# Patient Record
Sex: Male | Born: 1960 | Race: White | Hispanic: No | Marital: Single | Smoking: Never smoker
Health system: Southern US, Community
[De-identification: ages and names within clinical notes are randomized; demographics above are authoritative.]

## PROBLEM LIST (undated history)

## (undated) DIAGNOSIS — F319 Bipolar disorder, unspecified: Secondary | ICD-10-CM

## (undated) DIAGNOSIS — J45909 Unspecified asthma, uncomplicated: Secondary | ICD-10-CM

## (undated) DIAGNOSIS — E079 Disorder of thyroid, unspecified: Secondary | ICD-10-CM

## (undated) HISTORY — DX: Disorder of thyroid, unspecified: E07.9

## (undated) HISTORY — DX: Unspecified asthma, uncomplicated: J45.909

## (undated) HISTORY — DX: Bipolar disorder, unspecified: F31.9

---
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
## 2011-04-20 ENCOUNTER — Inpatient Hospital Stay (HOSPITAL_COMMUNITY)
Admission: EM | Admit: 2011-04-20 | Discharge: 2011-04-22 | DRG: 313 | Disposition: A | Discharge status: Home / Self Care | Payer: 59 | Attending: Cardiology | Admitting: Cardiology

## 2011-04-20 ENCOUNTER — Emergency Department (HOSPITAL_COMMUNITY): Payer: 59

## 2011-04-20 DIAGNOSIS — I498 Other specified cardiac arrhythmias: Secondary | ICD-10-CM | POA: Diagnosis present

## 2011-04-20 DIAGNOSIS — E785 Hyperlipidemia, unspecified: Secondary | ICD-10-CM | POA: Diagnosis present

## 2011-04-20 DIAGNOSIS — J45909 Unspecified asthma, uncomplicated: Secondary | ICD-10-CM | POA: Diagnosis present

## 2011-04-20 DIAGNOSIS — R079 Chest pain, unspecified: Secondary | ICD-10-CM

## 2011-04-20 DIAGNOSIS — R0789 Other chest pain: Principal | ICD-10-CM | POA: Diagnosis present

## 2011-04-20 DIAGNOSIS — I318 Other specified diseases of pericardium: Secondary | ICD-10-CM | POA: Diagnosis present

## 2011-04-20 DIAGNOSIS — Z880 Allergy status to penicillin: Secondary | ICD-10-CM

## 2011-04-20 LAB — DIFFERENTIAL
Basophils Relative: 1 % (ref 0–1)
Eosinophils Absolute: 0.2 10*3/uL (ref 0.0–0.7)
Eosinophils Relative: 2 % (ref 0–5)
Monocytes Absolute: 0.7 10*3/uL (ref 0.1–1.0)
Monocytes Relative: 8 % (ref 3–12)
Neutrophils Relative %: 48 % (ref 43–77)

## 2011-04-20 LAB — POCT I-STAT TROPONIN I: Troponin i, poc: 0.02 ng/mL (ref 0.00–0.08)

## 2011-04-20 LAB — CBC
MCH: 30.8 pg (ref 26.0–34.0)
MCHC: 36.6 g/dL — ABNORMAL HIGH (ref 30.0–36.0)
Platelets: 233 10*3/uL (ref 150–400)
RDW: 12 % (ref 11.5–15.5)

## 2011-04-20 LAB — CK TOTAL AND CKMB (NOT AT ARMC)
CK, MB: 6.7 ng/mL (ref 0.3–4.0)
Total CK: 237 U/L — ABNORMAL HIGH (ref 7–232)

## 2011-04-20 LAB — COMPREHENSIVE METABOLIC PANEL
Alkaline Phosphatase: 52 U/L (ref 39–117)
BUN: 15 mg/dL (ref 6–23)
CO2: 26 mEq/L (ref 19–32)
Chloride: 103 mEq/L (ref 96–112)
Creatinine, Ser: 0.81 mg/dL (ref 0.50–1.35)
GFR calc non Af Amer: >60 mL/min (ref >60)
Potassium: 3.6 mEq/L (ref 3.5–5.1)
Total Bilirubin: 0.7 mg/dL (ref 0.3–1.2)

## 2011-04-20 LAB — POCT I-STAT, CHEM 8
BUN: 16 mg/dL (ref 6–23)
Calcium, Ion: 1.23 mmol/L (ref 1.12–1.32)
Glucose, Bld: 95 mg/dL (ref 70–99)
TCO2: 24 mmol/L (ref 0–100)

## 2011-04-21 ENCOUNTER — Inpatient Hospital Stay (HOSPITAL_COMMUNITY): Payer: 59

## 2011-04-21 DIAGNOSIS — R072 Precordial pain: Secondary | ICD-10-CM

## 2011-04-21 LAB — LIPID PANEL
HDL: 36 mg/dL — ABNORMAL LOW (ref >39)
LDL Cholesterol: 130 mg/dL — ABNORMAL HIGH (ref 0–99)
Triglycerides: 231 mg/dL — ABNORMAL HIGH (ref <150)
VLDL: 46 mg/dL — ABNORMAL HIGH (ref 0–40)

## 2011-04-21 LAB — CARDIAC PANEL(CRET KIN+CKTOT+MB+TROPI)
CK, MB: 5.4 ng/mL — ABNORMAL HIGH (ref 0.3–4.0)
CK, MB: 5.2 ng/mL — ABNORMAL HIGH (ref 0.3–4.0)
Relative Index: 3.1 — ABNORMAL HIGH (ref 0.0–2.5)
Troponin I: <0.3 ng/mL (ref <0.30)
Troponin I: <0.3 ng/mL (ref <0.30)

## 2011-04-21 LAB — HEMOGLOBIN A1C: Mean Plasma Glucose: 117 mg/dL — ABNORMAL HIGH (ref <117)

## 2011-04-21 MED ORDER — IOHEXOL 300 MG/ML  SOLN
80.0000 mL | Freq: Once | INTRAMUSCULAR | Status: AC | PRN
  Administered 2011-04-21: 80 mL via INTRAVENOUS

## 2011-04-22 ENCOUNTER — Inpatient Hospital Stay (HOSPITAL_COMMUNITY): Payer: 59

## 2011-04-22 DIAGNOSIS — R079 Chest pain, unspecified: Secondary | ICD-10-CM

## 2011-04-22 MED ORDER — TECHNETIUM TC 99M TETROFOSMIN IV KIT: 10.0000 | PACK | Freq: Once | INTRAVENOUS | Status: DC | PRN

## 2011-04-22 MED ORDER — TECHNETIUM TC 99M TETROFOSMIN IV KIT
30.0000 | PACK | Freq: Once | INTRAVENOUS | Status: AC | PRN
  Administered 2011-04-22: 30 via INTRAVENOUS

## 2011-04-22 MED ORDER — TECHNETIUM TC 99M TETROFOSMIN IV KIT
30.0000 | PACK | Freq: Once | INTRAVENOUS | Status: AC | PRN
  Administered 2011-04-22: 15:00:00 30 via INTRAVENOUS

## 2011-04-23 NOTE — Discharge Summary (Addendum)
NAME:  Erik Arroyo, Erik Arroyo NO.:  [id number]  MEDICAL RECORD NO.:  [id number]  LOCATION:  6533                         FACILITY:  [facility]  PHYSICIAN:  Erik Arroyo, MDDATE OF BIRTH:  07/08/1961  DATE OF ADMISSION:  04/20/2011 DATE OF DISCHARGE:  04/22/2011                              DISCHARGE SUMMARY   DISCHARGE DIAGNOSES: 1. Chest pain, felt atypical     a.     Initially elevated CKs and MBs, but negative troponins x3.     b.     Nuclear Myoview stress test April 22, 2011, demonstrating      no evidence of ischemia or infarction with ejection fraction of      53%  c.  CT angio April 21, 2011, demonstrated no evidence of      pulmonary embolism. 2. Oval-shaped fluid density, question small pericardial cyst by CT     angio of April 21, 2011, for cardiac MRI to be scheduled to     further evaluate. 3. Dyslipidemia, for outpatient followup with primary care provider. 4. 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 5. History of tooth abscess. 6. Asthma. 7. History of appendectomy. 8. History of tonsillectomy.  HOSPITAL COURSE:  [name] is a 50 year old gentleman with a history of asymptomatic bradycardia and asthma with no prior history of coronary artery disease.  He presented to his PCP's office for chest pain, centralized chest pressure not associated with any shortness of breath or nausea but was associated with clamminess.  It was nonradiating, but has been worse with inspiration.  He has not noted any exertional symptoms.  His symptoms were felt to be both typical and atypical.  EKG demonstrated no acute ST-T changes.  Initial point-of-care troponin was negative.  He was admitted to the hospital for rule out and did have elevation CK to peak of 237 and peak MB of 6.7, but negative troponin x3.  He had a CT angio that demonstrated no evidence of pulmonary embolism.  He had a 2D nuclear Myoview stress test demonstrating no evidence  of ischemia or infarction.  EF was 53%.  The patient tolerated both procedures well.  His chest pain felt to be noncardiac in etiology. I personally discussed the CT angio results with Dr. Clifton Arroyo regarding the possible pericardial cyst as described below, who has recommended that the patient have a cardiac MRI.  This will be scheduled by our office.  Dr. Clifton Arroyo has seen and examined the patient today and feels he is stable for discharge.  The patient was concern that some of his symptoms may be related to tooth abscess.  He stated that they were similar to when he had a tooth abscess remotely.  However, physical examination by PA Erik Arroyo was unremarkable and white blood cell count was not elevated.  The patient was afebrile.  He has already scheduled an appointment with his PCP for tomorrow morning and was instructed to keep this appointment. Dr. Clifton Arroyo would like to hold off on antibiotic usage for now and strongly encouraged the patient to follow up with his primary care provider.  DISCHARGE LABS:  WBC 7.9, hemoglobin 16, hematocrit 47, platelet count 233.  Sodium 130, potassium  3.6, chloride 103, CO2 of 26, glucose 85, BUN 15, creatinine 0.81.  LFTs were normal.  A1c was 5.7.  The cardiac enzymes described as above.  Total cholesterol 212, triglycerides 231, HDL 36, LDL 130.  The patient was instructed to follow up with his primary care provider regarding initiation of lifestyle changes and further monitoring of hyperlipidemia.  STUDIES: 1. Stress test on April 22, 2011, demonstrated no evidence of     ischemia or infarction.  EF was 53%. 2. CT angio April 21, 2011, which demonstrated no PE.  Oval-shaped     fluid density collection along the superior pericardial recess, may     represents some pericardial cyst 3.2 x 1.7 cm or unusual localized     collection of fluid in the superior pericardial recess.  DISCHARGE MEDICATIONS: 1. Advair Diskus 250/50 one puff  inhale daily. 2. Multivitamin half tablet 2 times weekly. 3. St. John Wort 1 tablet daily nightly. 4. Vitamin D3 one tablet 1-2 times weekly.  ALLERGIES:  PENICILLIN.  DISPOSITION:  Mr. Erik Arroyo will be discharged in stable condition to home. He is to follow a heart-healthy diet and keep his IV site clean and dry. He already has an appointment scheduled tomorrow with his primary care provider at 11:20 a.m. and is instructed to keep this appointment for further evaluation as there are 2 concerns.  He will have a cardiac MRI to further evaluate possible pericardial cyst by CT angio which will be scheduled in our office and will call him with this appointment.  Our office will also call him with a followup appointment with Dr. Myrtis Ser sometime thereafter.  The patient specifically requested that his record to be sent over to Dr. Tawana Arroyo office.  However had been notified by the floor staff upon request of this to be done that the patient must go to medical records in the morning.  He is aware of this plan.  DURATION OF DISCHARGE ENCOUNTER:  Greater than 30 minutes including physician and PA time.     Erik Arroyo, P.A.C.   ______________________________ Erik Carrow, MD    DD/MEDQ  D:  04/22/2011  T:  04/22/2011  Job:  [id number]  cc:   Gloriajean Dell. Andrey Campanile, M.D. Luis Abed, MD, [facility]  Electronically Signed by Erik Carrow MD on 04/23/2011 05:02:30 PM Electronically Signed by Erik Arroyo  on 05/04/2011 07:32:22 PM

## 2011-04-30 ENCOUNTER — Telehealth: Payer: Self-pay | Admitting: Cardiology

## 2011-05-01 NOTE — Telephone Encounter (Signed)
Per after hour voice mail  need an order for cardiac mri .

## 2011-05-04 ENCOUNTER — Other Ambulatory Visit: Payer: Self-pay

## 2011-05-07 NOTE — H&P (Signed)
NAME:  ALOK, MINSHALL NO.:  [id number]  MEDICAL RECORD NO.:  [id number]  LOCATION:  6533                         FACILITY:  [facility]  PHYSICIAN:  Harlon Flor, MD   DATE OF BIRTH:  1961-07-21  DATE OF ADMISSION:  04/20/2011 DATE OF DISCHARGE:                             HISTORY & PHYSICAL   PRIMARY PCP:  Gloriajean Dell. Andrey Campanile, MD  PRIMARY CARDIOLOGIST:  There is no primary cardiologist.  CHIEF COMPLAINT:  Chest pain.  HISTORY OF PRESENT ILLNESS:  Mr. Cowden is a pleasant 50 year old white male, who was sent here from his PCP's office with chest pain.  He says it started in the 11:30 this morning.  He noted centralized chest pressure, not associated with shortness of breath or nausea, but was associated with some clamminess.  It is not radiated, but it has been worse with inspiration.  He has not noted any worsening with walking or exertion.  There was some questionable relief with nitroglycerin.  His pain is almost gone, but still lingering at 1-2 currently.  His pain then was constant from 11:30 this morning until presenting to the emergency room.  This evening at 6 p.m. in which his enzymes were found to be negative x1.  He has never had a history of coronary disease.  PAST MEDICAL HISTORY: 1. Asymptomatic bradycardia evaluated years ago. 2. History of a tooth abscess. 3. Asthma. 4. History of appendectomy. 5. History of tonsillectomy.  MEDICATIONS:  Advair 250/50 b.i.d. and vitamin supplement.  ALLERGIES:  PENICILLIN causes a rash.  SOCIAL HISTORY:  The patient lives alone in [place].  He works in [employer].  He is not married.  He does not smoke tobacco and drinks only occasionally.  FAMILY HISTORY:  His mother was diagnosed with coronary artery disease in her 28s.  His father does not have any medical problems and his siblings are healthy.  REVIEW OF SYSTEMS:  A full review of systems is obtained and is negative except as stated  in the HPI other than headache that began prior to him receiving nitroglycerin earlier today.  PHYSICAL EXAMINATION:  VITAL SIGNS:  Blood pressure 110/57, pulse 52, temperature 96.7, respiratory rate 18. GENERAL:  No acute distress. HEENT:  Extraocular movements intact.  Oropharynx benign.  Nonicteric sclerae. NECK:  Supple. CARDIOVASCULAR:  Regular rhythm with normal S1 and S2.  No murmurs. LUNGS:  Clear to auscultation bilaterally. ABDOMEN:  Soft, nontender, nondistended.  No hepatosplenomegaly. EXTREMITIES:  No clubbing, cyanosis, or edema. SKIN:  No rashes. Lymph:  No lymphadenopathy. NEUROLOGIC:  He is alert and oriented x3 and moves all extremities well. No gross deficits.  Pulses are intact throughout.  Right femoral is 2+ bilateral dorsalis pedis, posterior tibial 2+.  Chest x-ray reviewed and is clear.  EKG shows sinus bradycardia with a rate of 50, and no acute ST-T changes.  White count is 7.9, hemoglobin 16, platelets 233.  Basic metabolic panel is within normal limits.  His first point-of-care troponin is negative.  ASSESSMENT AND PLAN:  Mr. Sagona is a pleasant 50 year old white male, here with unstable angina.  He has some typical and atypical features of his pain.  His first set of  cardiac markers is negative. 1. Unstable angina:  We will give him aspirin 325 as well as Lovenox 1     mcg/kg every 12-hour subcu.  If he rules in with positive     biomarkers, we will also add Plavix.  We will check his baseline     labs including lipids and depending on how he does tonight, we will     plan for cath versus further risk stratification with stress     testing if his enzymes are negative. 2. Bradycardia:  He is asymptomatic, has not had any syncopal or near     syncopal spells.  No evidence of heart block on EKG.  We will keep     him on telemetry tonight.     Harlon Flor, MD     MMB/MEDQ  D:  04/20/2011  T:  04/21/2011  Job:  [id number]  cc:   Gloriajean Dell.  Andrey Campanile, M.D.  Electronically Signed by Meridee Score MD on 05/07/2011 08:14:03 PM

## 2011-05-21 ENCOUNTER — Encounter: Payer: Self-pay | Admitting: Cardiology

## 2011-05-26 ENCOUNTER — Encounter: Payer: Self-pay | Admitting: *Deleted

## 2011-05-28 ENCOUNTER — Inpatient Hospital Stay (HOSPITAL_COMMUNITY): Admission: RE | Admit: 2011-05-28 | Payer: 59 | Source: Ambulatory Visit

## 2011-12-27 IMAGING — CT CT ANGIO CHEST
2 of 8 series · 13 of 36 positions shown · IV contrast (CONTRAST)
Comparison: 04/05/2011

CLINICAL DATA: Chest pain.

CT ANGIOGRAPHY CHEST WITH CONTRAST
TECHNIQUE: Multidetector CT imaging of the chest was performed
using the standard protocol during bolus administration of
intravenous contrast.  Multiplanar CT image reconstructions
including MIPs were obtained to evaluate the vascular anatomy.
Contrast:  80 ml Hmnipaque-QHH

[Series 5: pe thins · axial · 0.88mm/px · z∈[-343,-50]mm · 12 of 347 slices shown]
[im 27/347  lung]
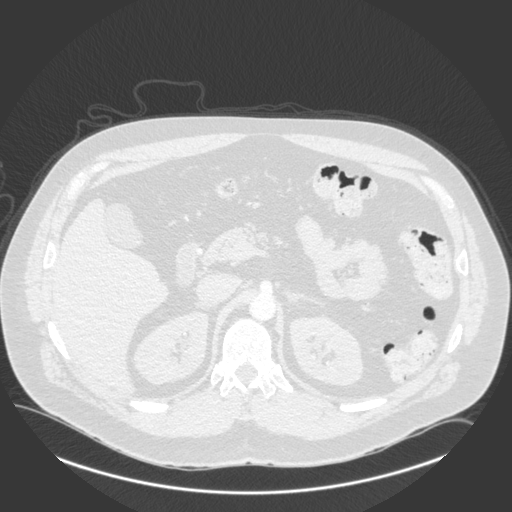
[im 54/347  mediastinal]
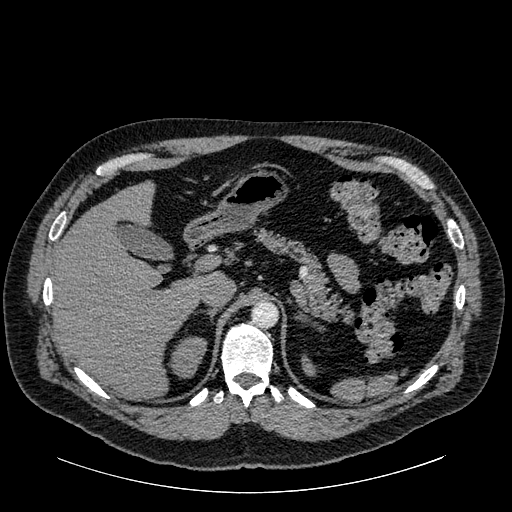
[im 80/347  lung]
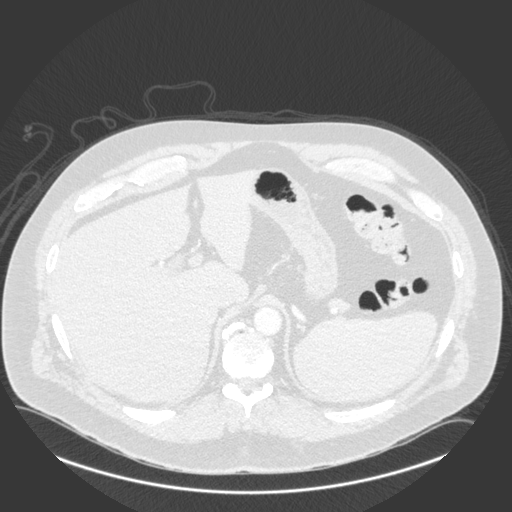
[im 107/347  mediastinal]
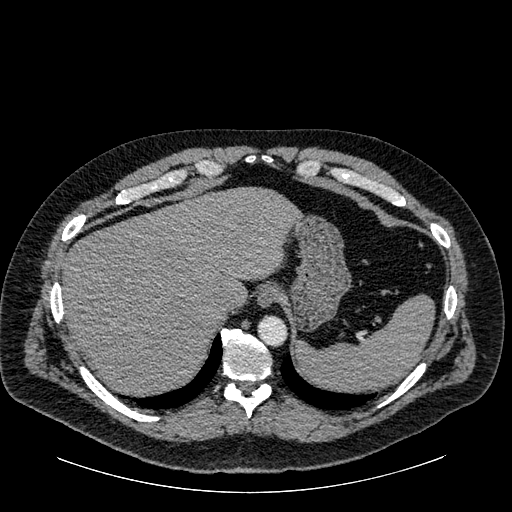
[im 134/347  lung]
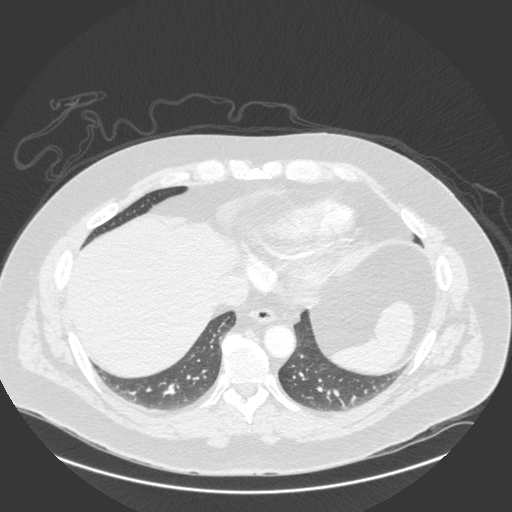
[im 160/347  mediastinal]
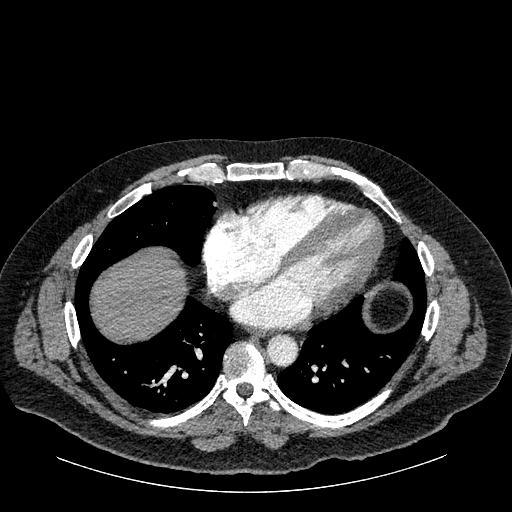
[im 187/347  lung]
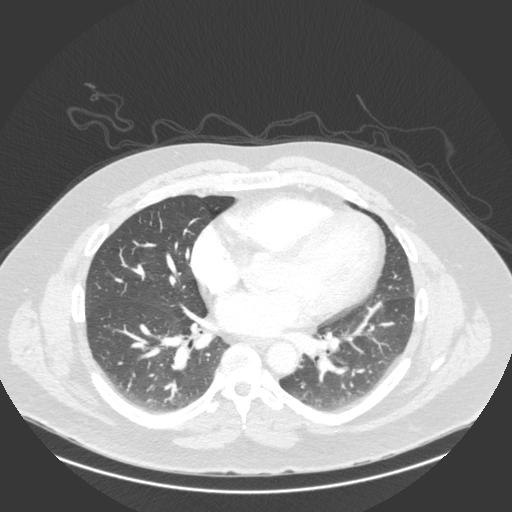
[im 213/347  mediastinal]
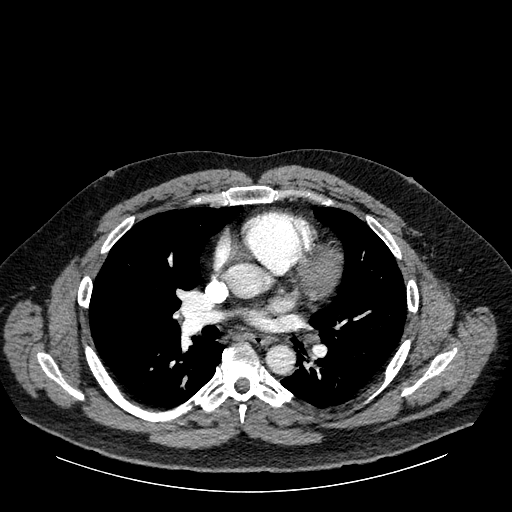
[im 240/347  lung]
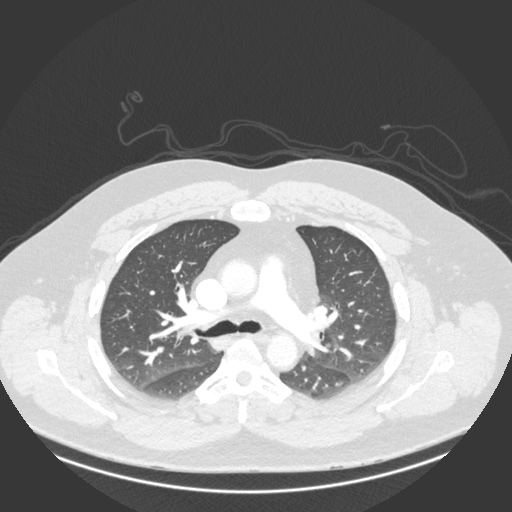
[im 267/347  mediastinal]
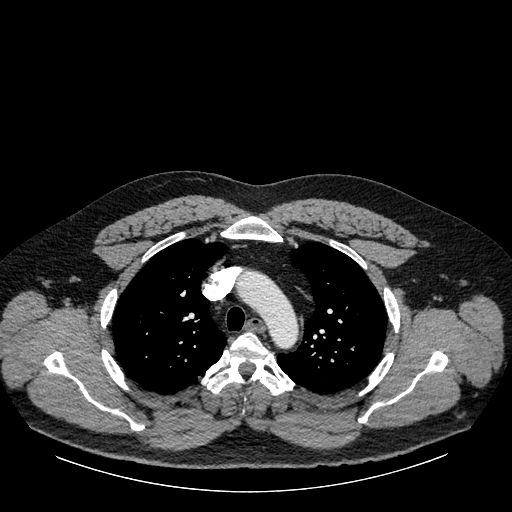
[im 293/347  lung]
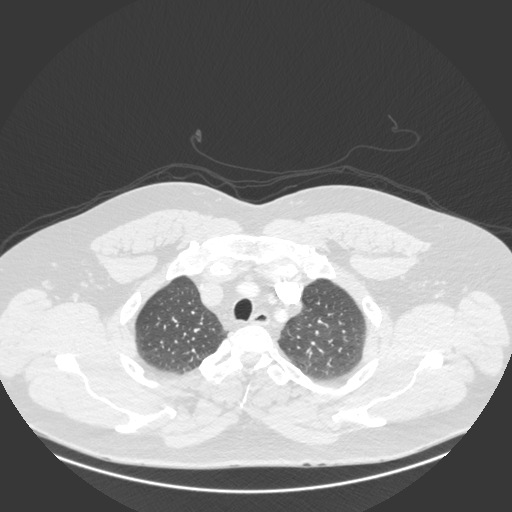
[im 320/347  mediastinal]
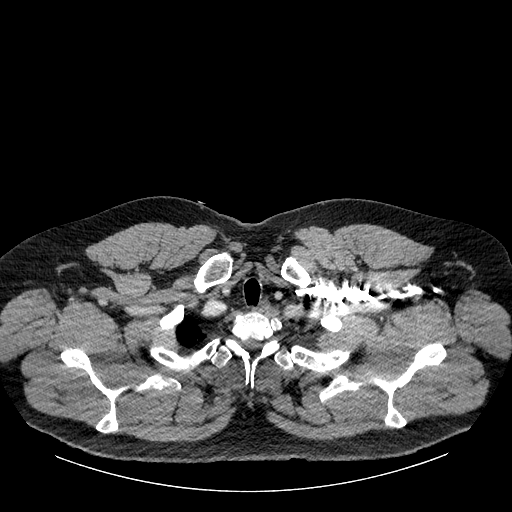

[coronals · coronal · 0.88mm/px · 1 of 138 slices shown]
[im 69/138  mediastinal]
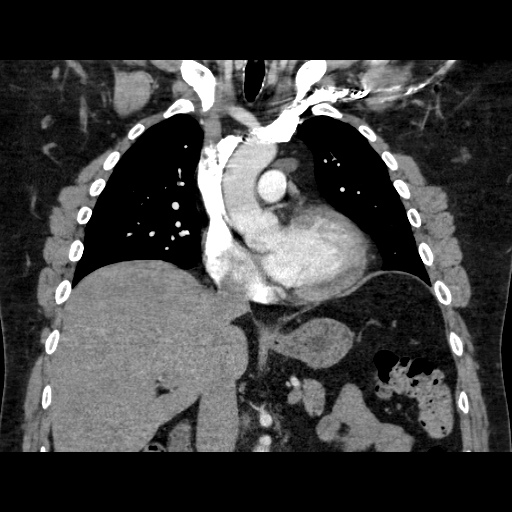

[13 of 36 positions shown; findings below may reference images not displayed]

FINDINGS: Contrast phase is moderately late, but images are felt
to be adequate for diagnosis.

No reflux of contrast into the IVC or significant contour
abnormality of the interventricular septum is identified to
indicate significantly elevated right heart pressures.

No filling defect is identified in the pulmonary arterial tree to
suggest pulmonary embolus.

No pericardial effusion is identified.  No thoracic aortic
dissection noted.  Several tiny subpleural nodules along the
superior extent of the right major fissure likely represent
subpleural lymph nodes.

Slight mosaic attenuation in the lower lobes is likely from air
trapping and mild dependent subsegmental atelectasis.

On image 48 of series 4, the fluid density oval shaped 3.2 x 1.7 cm
collection along the superior pericardial recess is identified.

Review of the MIP images confirms the above findings.
IMPRESSION: 1.  No embolus is observed.
2. Mild air trapping and dependent subsegmental atelectasis account
for the slight mosaic attenuation in the lower lobes.
3.  Oval shaped fluid density collection along the superior
pericardial recess may represent a small pericardial cyst or
unusual localized collection of fluid in the superior pericardial
recess.

## 2013-08-07 ENCOUNTER — Encounter: Payer: Self-pay | Admitting: Internal Medicine

## 2013-09-07 ENCOUNTER — Ambulatory Visit (AMBULATORY_SURGERY_CENTER): Payer: Self-pay | Admitting: *Deleted

## 2013-09-07 ENCOUNTER — Telehealth: Payer: Self-pay | Admitting: *Deleted

## 2013-09-07 VITALS — Ht 78.0 in | Wt 354.4 lb

## 2013-09-07 DIAGNOSIS — Z1211 Encounter for screening for malignant neoplasm of colon: Secondary | ICD-10-CM

## 2013-09-07 MED ORDER — MOVIPREP 100 G PO SOLR
ORAL | Status: AC
Start: 2013-09-07 — End: ?

## 2013-09-07 NOTE — Telephone Encounter (Signed)
Okay for screening colon on my next hospital week.  Monday to follow procedure already scheduled would be good

## 2013-09-07 NOTE — Telephone Encounter (Signed)
Dr Hilarie Fredrickson: pt was scheduled at [facility] for screening colonoscopy.  Pt is 6'6' and weighs 354.4lb.  His only medical history is asthma.  Is he okay for direct hospital or do you want him to have OV before scheduling at hospital?  Caren Griffins:  I gave pt general instructions for Moviprep while he was in [place].  All you will need to do is review times with him when he is scheduled at hospital.

## 2013-09-07 NOTE — Progress Notes (Signed)
No allergies to eggs or soy. No problems with anesthesia.  

## 2013-09-08 ENCOUNTER — Other Ambulatory Visit: Payer: Self-pay | Admitting: *Deleted

## 2013-09-08 DIAGNOSIS — Z1211 Encounter for screening for malignant neoplasm of colon: Secondary | ICD-10-CM

## 2013-09-08 NOTE — Telephone Encounter (Signed)
lmom for pt to call me back. He is scheduled at [facility] on 09/25/13 at 10:45am. I mailed him corrected prep instructions. For the procedure.

## 2013-09-11 ENCOUNTER — Telehealth: Payer: Self-pay | Admitting: Internal Medicine

## 2013-09-11 NOTE — Telephone Encounter (Signed)
noted 

## 2013-09-11 NOTE — Telephone Encounter (Signed)
lmom for pt to call back. He did identify himself on the answering machine, so I left a detailed message of procedure date and that I did mail him a letter. He may call back for questions.

## 2013-09-11 NOTE — Telephone Encounter (Signed)
Girlfriend called, but pt was in the room, to ask if we can r/s the procedure to another day. R/S to 09/29/12 and friend informed.

## 2013-09-12 ENCOUNTER — Encounter: Payer: Self-pay | Admitting: Internal Medicine

## 2013-09-13 ENCOUNTER — Encounter (HOSPITAL_COMMUNITY): Payer: Self-pay | Admitting: Pharmacy Technician

## 2013-09-21 ENCOUNTER — Encounter: Payer: 59 | Admitting: Internal Medicine

## 2013-09-26 ENCOUNTER — Encounter (HOSPITAL_COMMUNITY): Payer: Self-pay | Admitting: *Deleted

## 2013-09-29 ENCOUNTER — Ambulatory Visit (HOSPITAL_COMMUNITY): Payer: 59 | Admitting: Anesthesiology

## 2013-09-29 ENCOUNTER — Encounter (HOSPITAL_COMMUNITY)
Admission: RE | Disposition: A | Discharge status: Home / Self Care | Payer: Self-pay | Source: Ambulatory Visit | Attending: Internal Medicine

## 2013-09-29 ENCOUNTER — Encounter (HOSPITAL_COMMUNITY): Payer: Self-pay

## 2013-09-29 ENCOUNTER — Encounter (HOSPITAL_COMMUNITY): Payer: 59 | Admitting: Anesthesiology

## 2013-09-29 ENCOUNTER — Ambulatory Visit (HOSPITAL_COMMUNITY)
Admission: RE | Admit: 2013-09-29 | Discharge: 2013-09-29 | Disposition: A | Discharge status: Home / Self Care | Payer: 59 | Source: Ambulatory Visit | Attending: Internal Medicine | Admitting: Internal Medicine

## 2013-09-29 DIAGNOSIS — Z9089 Acquired absence of other organs: Secondary | ICD-10-CM | POA: Insufficient documentation

## 2013-09-29 DIAGNOSIS — Z1211 Encounter for screening for malignant neoplasm of colon: Secondary | ICD-10-CM

## 2013-09-29 DIAGNOSIS — J45909 Unspecified asthma, uncomplicated: Secondary | ICD-10-CM

## 2013-09-29 DIAGNOSIS — E669 Obesity, unspecified: Secondary | ICD-10-CM | POA: Insufficient documentation

## 2013-09-29 DIAGNOSIS — Z79899 Other long term (current) drug therapy: Secondary | ICD-10-CM | POA: Insufficient documentation

## 2013-09-29 DIAGNOSIS — D126 Benign neoplasm of colon, unspecified: Secondary | ICD-10-CM

## 2013-09-29 HISTORY — PX: COLONOSCOPY: SHX5424

## 2013-09-29 SURGERY — COLONOSCOPY
Anesthesia: Monitor Anesthesia Care

## 2013-09-29 MED ORDER — PROPOFOL INFUSION 10 MG/ML OPTIME
INTRAVENOUS | Status: DC | PRN
  Administered 2013-09-29: 300 ug/kg/min via INTRAVENOUS

## 2013-09-29 MED ORDER — EPHEDRINE SULFATE 50 MG/ML IJ SOLN
INTRAMUSCULAR | Status: DC | PRN
  Administered 2013-09-29: 10 mg via INTRAVENOUS
  Administered 2013-09-29 (×2): 5 mg via INTRAVENOUS

## 2013-09-29 MED ORDER — PROPOFOL 10 MG/ML IV BOLUS
INTRAVENOUS | Status: AC
  Filled 2013-09-29: qty 20

## 2013-09-29 MED ORDER — KETAMINE HCL 10 MG/ML IJ SOLN
INTRAMUSCULAR | Status: DC | PRN
  Administered 2013-09-29: 50 mg via INTRAVENOUS

## 2013-09-29 MED ORDER — SODIUM CHLORIDE 0.9 % IV SOLN: INTRAVENOUS | Status: DC

## 2013-09-29 MED ORDER — EPHEDRINE SULFATE 50 MG/ML IJ SOLN
INTRAMUSCULAR | Status: AC
  Filled 2013-09-29: qty 1

## 2013-09-29 MED ORDER — LACTATED RINGERS IV SOLN
INTRAVENOUS | Status: DC
  Administered 2013-09-29: 09:00:00 via INTRAVENOUS

## 2013-09-29 NOTE — Anesthesia Postprocedure Evaluation (Signed)
  Anesthesia Post-op Note  Patient: Erik Arroyo  Procedure(s) Performed: Procedure(s) (LRB): COLONOSCOPY (N/A)  Patient Location: PACU  Anesthesia Type: MAC  Level of Consciousness: awake and alert   Airway and Oxygen Therapy: Patient Spontanous Breathing  Post-op Pain: mild  Post-op Assessment: Post-op Vital signs reviewed, Patient's Cardiovascular Status Stable, Respiratory Function Stable, Patent Airway and No signs of Nausea or vomiting  Last Vitals:  Filed Vitals:   09/29/13 1150  BP: 102/54  Pulse:   Temp:   Resp: 16    Post-op Vital Signs: stable   Complications: No apparent anesthesia complications

## 2013-09-29 NOTE — Preoperative (Signed)
Beta Blockers   Reason not to administer Beta Blockers:Not Applicable, not on home BB 

## 2013-09-29 NOTE — H&P (Signed)
  HPI: 53 year old with a history of asthma and obesity who presents for outpatient screening colonoscopy. His primary care providers Dr. Redmond Pulling who recommended the test. He reports he tolerated the prep well. Negative family history for colon cancer. For the last 2-3 weeks he has felt an abnormal sensation in his rectum. He has a difficult time describing this feeling and it doesn't seem to relate to bowel movement. Not described as a true pain but at times can be uncomfortable lasting only a few seconds. No rectal bleeding or melena.  Past Medical History  Diagnosis Date  . Asthma     Tx. Advair daily    Past Surgical History  Procedure Laterality Date  . Tonsillectomy  1969  . Appendectomy  1977    Current Facility-Administered Medications  Medication Dose Route Frequency Provider Last Rate Last Dose  . 0.9 %  sodium chloride infusion   Intravenous Continuous Jerene Bears, MD      . lactated ringers infusion   Intravenous Continuous Peyton Najjar, MD 20 mL/hr at 09/29/13 0857      Allergies  Allergen Reactions  . Penicillins Rash    Family History  Problem Relation Age of Onset  . Colon cancer Neg Hx     History  Substance Use Topics  . Smoking status: Never Smoker   . Smokeless tobacco: Never Used  . Alcohol Use: 3.0 oz/week    5 Cans of beer per week    ROS: As per history of present illness, otherwise negative  BP 136/99  Pulse 60  Temp(Src) 97.6 F (36.4 C) (Oral)  Resp 11  SpO2 97% Gen: awake, alert, NAD HEENT: anicteric, op clear CV: RRR, no mrg Pulm: CTA b/l Abd: soft, obese, NT/ND, +BS throughout Ext: no c/c/e Neuro: nonfocal  ASSESSMENT/PLAN: 53 year old with a history of asthma and obesity who presents for outpatient screening colonoscopy.   1.CRC screening -- avg risk from [facility] standpoint.  The nature of the procedure, as well as the risks, benefits, and alternatives were carefully and thoroughly reviewed with the patient. Ample time for  discussion and questions allowed. The patient understood, was satisfied, and agreed to proceed.

## 2013-09-29 NOTE — Discharge Instructions (Signed)
YOU HAD AN ENDOSCOPIC PROCEDURE TODAY: Refer to the procedure report that was given to you for any specific questions about what was found during the examination.  If the procedure report does not answer your questions, please call your gastroenterologist to clarify.  YOU SHOULD EXPECT: Some feelings of bloating in the abdomen. Passage of more gas than usual.  Walking can help get rid of the air that was put into your GI tract during the procedure and reduce the bloating. If you had a lower endoscopy (such as a colonoscopy or flexible sigmoidoscopy) you may notice spotting of blood in your stool or on the toilet paper.   DIET: Your first meal following the procedure should be a light meal and then it is ok to progress to your normal diet.  A half-sandwich or bowl of soup is an example of a good first meal.  Heavy or fried foods are harder to digest and may make you feel nasueas or bloated.  Drink plenty of fluids but you should avoid alcoholic beverages for 24 hours.  ACTIVITY: Your care partner should take you home directly after the procedure.  You should plan to take it easy, moving slowly for the rest of the day.  You can resume normal activity the day after the procedure however you should NOT DRIVE or use heavy machinery for 24 hours (because of the sedation medicines used during the test).    SYMPTOMS TO REPORT IMMEDIATELY  A gastroenterologist can be reached at any hour.  Please call your doctor's office for any of the following symptoms:   Following lower endoscopy (colonoscopy, flexible sigmoidoscopy)  Excessive amounts of blood in the stool  Significant tenderness, worsening of abdominal pains  Swelling of the abdomen that is new, acute  Fever of 100 or higher  Following upper endoscopy (EGD, EUS, ERCP)  Vomiting of blood or coffee ground material  New, significant abdominal pain  New, significant chest pain or pain under the shoulder blades  Painful or persistently difficult  swallowing  New shortness of breath  Black, tarry-looking stools  FOLLOW UP: If any biopsies were taken you will be contacted by phone or by letter within the next 1-3 weeks.  Call your gastroenterologist if you have not heard about the biopsies in 3 weeks.  Please also call your gastroenterologist's office with any specific questions about appointments or follow up tests. Colonoscopy Care After These instructions give you information on caring for yourself after your procedure. Your doctor may also give you more specific instructions. Call your doctor if you have any problems or questions after your procedure. HOME CARE  Take it easy for the next 24 hours.  Rest.  Walk or use warm packs on your belly (abdomen) if you have belly cramping or gas.  Do not drive for 24 hours.  You may shower.  Do not sign important papers or use machinery for 24 hours.  Drink enough fluids to keep your pee (urine) clear or pale yellow.  Resume your normal diet. Avoid heavy or fried foods.  Avoid alcohol.  Continue taking your normal medicines.  Only take medicine as told by your doctor. Do not take aspirin. If you had growths (polyps) removed:  Do not take aspirin.  Do not drink alcohol for 7 days or as told by your doctor.  Eat a soft diet for 24 hours. GET HELP RIGHT AWAY IF:  You have a fever.  You pass clumps of tissue (blood clots) or fill the toilet with  blood. °· You have belly pain that gets worse and medicine does not help. °· Your belly is puffy (swollen). °· You feel sick to your stomach (nauseous) or throw up (vomit). °MAKE SURE YOU: °· Understand these instructions. °· Will watch your condition. °· Will get help right away if you are not doing well or get worse. °Document Released: 09/12/2010 Document Revised: 11/02/2011 Document Reviewed: 04/17/2013 °[employer]® Patient Information ©2014 [employer]. ° °

## 2013-09-29 NOTE — Transfer of Care (Signed)
Immediate Anesthesia Transfer of Care Note  Patient: Erik Arroyo  Procedure(s) Performed: Procedure(s): COLONOSCOPY (N/A)  Patient Location: PACU, endo  Anesthesia Type:MAC  Level of Consciousness: Patient easily awoken, sedated, comfortable, cooperative, following commands, responds to stimulation.   Airway & Oxygen Therapy: Patient spontaneously breathing, ventilating well, oxygen via simple oxygen mask.  Post-op Assessment: Report given to PACU RN, vital signs reviewed and stable, moving all extremities.   Post vital signs: Reviewed and stable.  Complications: No apparent anesthesia complications

## 2013-09-29 NOTE — Anesthesia Preprocedure Evaluation (Addendum)
Anesthesia Evaluation  Patient identified by MRN, date of birth, ID band Patient awake    Reviewed: Allergy & Precautions, H&P , NPO status , Patient's Chart, lab work & pertinent test results  Airway Mallampati: II TM Distance: >3 FB Neck ROM: full    Dental no notable dental hx. (+) Teeth Intact and Dental Advisory Given   Pulmonary asthma ,  breath sounds clear to auscultation  Pulmonary exam normal       Cardiovascular Exercise Tolerance: Good negative cardio ROS  Rhythm:regular Rate:Normal     Neuro/Psych negative neurological ROS  negative psych ROS   GI/Hepatic negative GI ROS, Neg liver ROS,   Endo/Other  negative endocrine ROSMorbid obesity  Renal/GU negative Renal ROS  negative genitourinary   Musculoskeletal   Abdominal (+) + obese,   Peds  Hematology negative hematology ROS (+)   Anesthesia Other Findings   Reproductive/Obstetrics negative OB ROS                          Anesthesia Physical Anesthesia Plan  ASA: III  Anesthesia Plan: MAC   Post-op Pain Management:    Induction:   Airway Management Planned: Nasal Cannula  Additional Equipment:   Intra-op Plan:   Post-operative Plan:   Informed Consent: I have reviewed the patients History and Physical, chart, labs and discussed the procedure including the risks, benefits and alternatives for the proposed anesthesia with the patient or authorized representative who has indicated his/her understanding and acceptance.   Dental Advisory Given  Plan Discussed with: CRNA and Surgeon  Anesthesia Plan Comments:         Anesthesia Quick Evaluation

## 2013-09-29 NOTE — Op Note (Signed)
[facility] [place], [address]   COLONOSCOPY PROCEDURE REPORT  PATIENT: Erik Arroyo, Erik Arroyo  MR#: [id number] BIRTHDATE: December 07, 1960 , 53  yrs. old GENDER: Male ENDOSCOPIST: Jerene Bears, MD REFERRED JK:DTOI Redmond Pulling, MD PROCEDURE DATE:  09/29/2013 PROCEDURE:   Colonoscopy with cold biopsy polypectomy and Colonoscopy with snare polypectomy First Screening Colonoscopy - Avg.  risk and is 50 yrs.  old or older Yes.  Prior Negative Screening - Now for repeat screening. N/A  History of Adenoma - Now for follow-up colonoscopy & has been > or = to 3 yrs.  N/A  Polyps Removed Today? Yes. ASA CLASS:   Class II INDICATIONS:average risk screening and first colonoscopy. MEDICATIONS: MAC sedation, administered by CRNA and See Anesthesia Report.  DESCRIPTION OF PROCEDURE:   After the risks benefits and alternatives of the procedure were thoroughly explained, informed consent was obtained.  A digital rectal exam revealed no rectal mass.   The     endoscope was introduced through the anus and advanced to the cecum, which was identified by both the appendix and ileocecal valve. No adverse events experienced.   The quality of the prep was good, using MoviPrep  The instrument was then slowly withdrawn as the colon was fully examined.   COLON FINDINGS: Three sessile polyps measuring 3-6 mm in size were found in the ascending colon, at the hepatic flexure, and in the sigmoid colon.  Polypectomy was performed with cold forceps (1 at hep flexure) and using cold snare (2).  All resections were complete and all polyp tissue was completely retrieved.   The colon mucosa was otherwise normal.  Retroflexed views revealed no abnormalities.        The scope was withdrawn and the procedure completed. COMPLICATIONS: There were no complications.  ENDOSCOPIC IMPRESSION: 1.   Three sessile polyps measuring 3-6 mm in size were found in the ascending colon, at the hepatic flexure,  and in the sigmoid colon; Polypectomy was performed with cold forceps and using cold snare 2.   The colon mucosa was otherwise normal  RECOMMENDATIONS: 1.  Await pathology results 2.  If the polyps removed today are proven to be adenomatous (pre-cancerous) polyps, you will need a colonoscopy in 3 years. Otherwise you should continue to follow colorectal cancer screening guidelines for "routine risk" patients with a colonoscopy in 10 years.  You will receive a letter within 1-2 weeks with the results of your biopsy as well as final recommendations.  Please call my office if you have not received a letter after 3 weeks.   eSigned:  Jerene Bears, MD 09/29/2013 10:46 AM  cc: The Patient and Kathryne Eriksson, MD

## 2013-10-02 ENCOUNTER — Encounter (HOSPITAL_COMMUNITY): Payer: Self-pay | Admitting: Internal Medicine

## 2013-10-05 ENCOUNTER — Encounter: Payer: Self-pay | Admitting: Internal Medicine

## 2016-09-21 ENCOUNTER — Encounter: Payer: Self-pay | Admitting: Internal Medicine

## 2018-04-22 ENCOUNTER — Ambulatory Visit: Payer: Self-pay | Admitting: Podiatry

## 2018-08-19 ENCOUNTER — Ambulatory Visit: Payer: Self-pay | Admitting: Physician Assistant

## 2018-08-21 DIAGNOSIS — F3175 Bipolar disorder, in partial remission, most recent episode depressed: Secondary | ICD-10-CM | POA: Insufficient documentation

## 2018-09-13 ENCOUNTER — Telehealth: Payer: Self-pay | Admitting: Physician Assistant

## 2018-09-13 NOTE — Telephone Encounter (Signed)
PATIENT HAS NOT HAD LABS THAT WERE ORDERED, HAS AN APPT. ON Friday 01/24 WANTS TO KNOW CAN HE STILL COME TO APPT., IF HE HAS NOT HAD LABS, PLEASE ADVISE

## 2018-09-13 NOTE — Telephone Encounter (Signed)
Yes keep appt

## 2018-09-16 ENCOUNTER — Ambulatory Visit: Payer: Self-pay | Admitting: Physician Assistant

## 2018-09-16 ENCOUNTER — Encounter: Payer: Self-pay | Admitting: Physician Assistant

## 2018-09-16 ENCOUNTER — Other Ambulatory Visit: Payer: Self-pay | Admitting: Physician Assistant

## 2018-09-16 ENCOUNTER — Ambulatory Visit (INDEPENDENT_AMBULATORY_CARE_PROVIDER_SITE_OTHER): Payer: Self-pay | Admitting: Physician Assistant

## 2018-09-16 DIAGNOSIS — G4700 Insomnia, unspecified: Secondary | ICD-10-CM

## 2018-09-16 DIAGNOSIS — F319 Bipolar disorder, unspecified: Secondary | ICD-10-CM

## 2018-09-16 MED ORDER — LITHIUM CARBONATE 300 MG PO CAPS: 1500.0000 mg | ORAL_CAPSULE | Freq: Every day | ORAL | 1 refills | Status: DC

## 2018-09-16 MED ORDER — TRAZODONE HCL 100 MG PO TABS: 100.0000 mg | ORAL_TABLET | Freq: Every evening | ORAL | 1 refills | Status: DC | PRN

## 2018-09-16 NOTE — Progress Notes (Signed)
[facility]  Patient ID: Erik Arroyo,  MRN: [id number]  PCP: Christain Sacramento, MD  Date of Evaluation: 09/16/2018 Time spent:15 minutes  Chief Complaint:  Chief Complaint    Follow-up      HISTORY/CURRENT STATUS: HPI Here for routine med check.  Patient denies loss of interest in usual activities and is able to enjoy things.  Denies decreased energy or motivation.  Appetite has not changed.  No extreme sadness, tearfulness, or feelings of hopelessness.  Denies any changes in concentration, making decisions or remembering things.  Denies suicidal or homicidal thoughts.  Patient denies increased energy with decreased need for sleep, no increased talkativeness, no racing thoughts, no impulsivity or risky behaviors, no increased spending, no increased libido, no grandiosity.  As long as he takes the trazodone he sleeps well.  He had labs drawn this morning. Results pending.   Denies dizziness, syncope, seizures, numbness, tingling, tremor, tics, unsteady gait, slurred speech, confusion.   Denies muscle or joint pain, stiffness, or dystonia.  Individual Medical History/ Review of Systems: Changes? :No   Allergies: Penicillins  Current Medications:  Current Outpatient Medications:  .  fluticasone (FLONASE) 50 MCG/ACT nasal spray, Place 1 spray into both nostrils daily., Disp: , Rfl:  .  Fluticasone-Salmeterol (ADVAIR DISKUS) 250-50 MCG/DOSE AEPB, Inhale 1 puff into the lungs 2 (two) times daily., Disp: , Rfl:  .  lithium carbonate 300 MG capsule, Take 5 capsules (1,500 mg total) by mouth at bedtime., Disp: 450 capsule, Rfl: 1 .  traZODone (DESYREL) 100 MG tablet, Take 1-3 tablets (100-300 mg total) by mouth at bedtime as needed for sleep. 3 tabs, Disp: 270 tablet, Rfl: 1 .  MELATONIN PO, Take 4 mg by mouth at bedtime. , Disp: , Rfl:  .  MOVIPREP 100 G SOLR, moviprep as directed. No substitutions (Patient not taking: Reported on 09/16/2018), Disp: 1 kit, Rfl:  0 Medication Side Effects: none  Family Medical/ Social History: Changes? 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  Works a lot.   MENTAL HEALTH EXAM:  There were no vitals taken for this visit.There is no height or weight on file to calculate BMI.  General Appearance: Casual, Well Groomed and Obese  Eye Contact:  Good  Speech:  Clear and Coherent  Volume:  Normal  Mood:  Euthymic  Affect:  Appropriate  Thought Process:  Goal Directed  Orientation:  Full (Time, Place, and Person)  Thought Content: Logical   Suicidal Thoughts:  No  Homicidal Thoughts:  No  Memory:  WNL  Judgement:  Good  Insight:  Good  Psychomotor Activity:  Normal  Concentration:  Concentration: Good  Recall:  Good  Fund of Knowledge: Good  Language: Good  Assets:  Desire for Improvement  ADL's:  Intact  Cognition: WNL  Prognosis:  Good    DIAGNOSES:    ICD-10-CM   1. Bipolar I disorder ([place]) F31.9   2. Insomnia, unspecified type G47.00     Receiving Psychotherapy: No    RECOMMENDATIONS: Continue lithium 1500 mg nightly. Continue trazodone 100 to 300 mg nightly as needed sleep. Continue self-care with healthy diet, exercise. Once I get the lab results back, we will let him know what is lithium level he is and if there are any dose changes. Return in 6 months or sooner as needed.  Donnal Moat, PA-C

## 2018-09-17 LAB — LITHIUM LEVEL: Lithium Lvl: 0.4 mmol/L — ABNORMAL LOW (ref 0.6–1.2)

## 2018-09-17 LAB — TSH: TSH: 2.99 m[IU]/L (ref 0.40–4.50)

## 2018-10-31 ENCOUNTER — Telehealth: Payer: Self-pay

## 2018-10-31 MED ORDER — LITHIUM CARBONATE 300 MG PO TABS: 1500.0000 mg | ORAL_TABLET | Freq: Every day | ORAL | 1 refills | Status: DC

## 2018-10-31 NOTE — Telephone Encounter (Signed)
Received phone call from [place] on pt's Lithium 300mg  5 Q hs, he normally takes tablets instead of capsules. Switched over to tablets per pt request.

## 2018-11-11 NOTE — Progress Notes (Signed)
He seems to be doing well on current Li dose, so continue that, even w/ low level (2nd low in 2 months and he's taking med as dir.  TSH is fine.

## 2019-03-06 ENCOUNTER — Ambulatory Visit (INDEPENDENT_AMBULATORY_CARE_PROVIDER_SITE_OTHER): Payer: Self-pay | Admitting: Physician Assistant

## 2019-03-06 ENCOUNTER — Encounter: Payer: Self-pay | Admitting: Physician Assistant

## 2019-03-06 ENCOUNTER — Other Ambulatory Visit: Payer: Self-pay

## 2019-03-06 DIAGNOSIS — F319 Bipolar disorder, unspecified: Secondary | ICD-10-CM

## 2019-03-06 DIAGNOSIS — G4700 Insomnia, unspecified: Secondary | ICD-10-CM

## 2019-03-06 MED ORDER — TRAZODONE HCL 100 MG PO TABS: 100.0000 mg | ORAL_TABLET | Freq: Every evening | ORAL | 1 refills | Status: DC | PRN

## 2019-03-06 MED ORDER — LITHIUM CARBONATE 300 MG PO TABS: 1500.0000 mg | ORAL_TABLET | Freq: Every day | ORAL | 1 refills | Status: DC

## 2019-03-06 NOTE — Progress Notes (Signed)
[facility]  Patient ID: Erik Arroyo,  MRN: [id number]  PCP: Christain Sacramento, MD  Date of Evaluation: 03/06/2019 Time spent:15 minutes  Chief Complaint:  Chief Complaint    Follow-up     Virtual Visit via Telephone Note  I connected with patient by a video enabled telemedicine application or telephone, with their informed consent, and verified patient privacy and that I am speaking with the correct person using two identifiers.  I am private, in my home and the patient is home.  I discussed the limitations, risks, security and privacy concerns of performing an evaluation and management service by telephone and the availability of in person appointments. I also discussed with the patient that there may be a patient responsible charge related to this service. The patient expressed understanding and agreed to proceed.   I discussed the assessment and treatment plan with the patient. The patient was provided an opportunity to ask questions and all were answered. The patient agreed with the plan and demonstrated an understanding of the instructions.   The patient was advised to call back or seek an in-person evaluation if the symptoms worsen or if the condition fails to improve as anticipated.  I provided 15 minutes of non-face-to-face time during this encounter.  HISTORY/CURRENT STATUS: HPI For routine med check.   Is on [employer] and has 60 # in the past 6 months or so. Doing great as far as that goes.  Feels that mood is good. Was lonely during the pandemic but is better now since he's been able to go back to church and be around people some.  His mother is in an assisted living home and even though he is able to see her through the window, "it is not the same."  States he is glad to be able to see her at all though.  Work is going well.  He still wants to find a new job.  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  He prefers to be around  people.  He sleeps well with the trazodone.  Patient denies loss of interest in usual activities and is able to enjoy things.  Denies decreased energy or motivation.  Appetite has not changed.  No extreme sadness, tearfulness, or feelings of hopelessness.  Denies any changes in concentration, making decisions or remembering things.  Denies suicidal or homicidal thoughts.  Patient denies increased energy with decreased need for sleep, no increased talkativeness, no racing thoughts, no impulsivity or risky behaviors, no increased spending, no increased libido, no grandiosity.  Denies dizziness, syncope, seizures, numbness, tingling, tremor, tics, unsteady gait, slurred speech, confusion. Denies muscle or joint pain, stiffness, or dystonia.  Individual Medical History/ Review of Systems: Changes? :No    Past medications for mental health diagnoses include: uncertain  Allergies: Penicillins  Current Medications:  Current Outpatient Medications:  .  fluticasone (FLONASE) 50 MCG/ACT nasal spray, Place 1 spray into both nostrils daily., Disp: , Rfl:  .  Fluticasone-Salmeterol (ADVAIR DISKUS) 250-50 MCG/DOSE AEPB, Inhale 1 puff into the lungs 2 (two) times daily., Disp: , Rfl:  .  lithium 300 MG tablet, Take 5 tablets (1,500 mg total) by mouth at bedtime., Disp: 450 tablet, Rfl: 1 .  traZODone (DESYREL) 100 MG tablet, Take 1-3 tablets (100-300 mg total) by mouth at bedtime as needed for sleep. 3 tabs, Disp: 270 tablet, Rfl: 1 .  MOVIPREP 100 G SOLR, moviprep as directed. No substitutions (Patient  not taking: Reported on 09/16/2018), Disp: 1 kit, Rfl: 0 Medication Side Effects: none  Family Medical/ Social History: Changes? No  MENTAL HEALTH EXAM:  There were no vitals taken for this visit.There is no height or weight on file to calculate BMI.  General Appearance: unable to assess  Eye Contact:  unable to assess  Speech:  Clear and Coherent  Volume:  Normal  Mood:  Euthymic  Affect:  unable  to assess  Thought Process:  Goal Directed  Orientation:  Full (Time, Place, and Person)  Thought Content: Logical   Suicidal Thoughts:  No  Homicidal Thoughts:  No  Memory:  WNL  Judgement:  Good  Insight:  Good  Psychomotor Activity:  unable to assess  Concentration:  Concentration: Good  Recall:  Good  Fund of Knowledge: Good  Language: Good  Assets:  Desire for Improvement  ADL's:  Intact  Cognition: WNL  Prognosis:  Good  09/17/2018 lithium level was 0.4, while on 1500 mg of lithium.  DIAGNOSES:    ICD-10-CM   1. Bipolar I disorder ([place])  F31.9   2. Insomnia, unspecified type  G47.00     Receiving Psychotherapy: No    RECOMMENDATIONS:  Continue lithium 300 mg, 5 p.o. nightly. Continue trazodone 100 mg, 1-3 p.o. nightly as needed sleep. Draw lithium level at next visit. Return in 6 months.  Donnal Moat, PA-C   This record has been created using [employer].  Chart creation errors have been sought, but may not always have been located and corrected. Such creation errors do not reflect on the standard of medical care.

## 2019-03-17 ENCOUNTER — Ambulatory Visit: Payer: Self-pay | Admitting: Physician Assistant

## 2019-08-30 ENCOUNTER — Other Ambulatory Visit: Payer: Self-pay | Admitting: Physician Assistant

## 2019-08-30 ENCOUNTER — Telehealth: Payer: Self-pay | Admitting: Physician Assistant

## 2019-08-30 DIAGNOSIS — Z79899 Other long term (current) drug therapy: Secondary | ICD-10-CM

## 2019-08-30 NOTE — Telephone Encounter (Signed)
done

## 2019-08-30 NOTE — Telephone Encounter (Signed)
I ordered the labs.  Please mail him the printed copies of the orders.  They should be on the printer up front.  Thanks.

## 2019-08-30 NOTE — Telephone Encounter (Signed)
Patient called and said that he goes to [facility] [address]. Please send the request there

## 2019-09-01 LAB — LITHIUM LEVEL: Lithium Lvl: 0.6 mmol/L (ref 0.6–1.2)

## 2019-09-01 LAB — BASIC METABOLIC PANEL
BUN: 18 mg/dL (ref 7–25)
CO2: 25 mmol/L (ref 20–32)
Calcium: 9.6 mg/dL (ref 8.6–10.3)
Chloride: 105 mmol/L (ref 98–110)
Creat: 1 mg/dL (ref 0.70–1.33)
Glucose, Bld: 95 mg/dL (ref 65–99)
Potassium: 4.1 mmol/L (ref 3.5–5.3)
Sodium: 138 mmol/L (ref 135–146)

## 2019-09-01 LAB — TSH: TSH: 6.27 mIU/L — ABNORMAL HIGH (ref 0.40–4.50)

## 2019-09-01 NOTE — Progress Notes (Signed)
Left him a vm to return my call.  

## 2019-09-01 NOTE — Progress Notes (Signed)
Please let him know that his thyroid test is slightly abnormal.  Not serious.  He has an appt w/ me on 09/05/19 and I'll discuss it w/ him then.  Labs are nl otherwise. lithium level is good.

## 2019-09-04 NOTE — Progress Notes (Signed)
Okay; thanks.

## 2019-09-04 NOTE — Progress Notes (Signed)
He has an appt w/ me tomorrow, so I'll discuss w/ him then.  If for some reason, he doesn't come in, I'll let you know and ask you to call him again.  Thanks.

## 2019-09-04 NOTE — Progress Notes (Signed)
Left him another VM today to return my call.

## 2019-09-05 ENCOUNTER — Ambulatory Visit: Payer: BLUE CROSS/BLUE SHIELD | Admitting: Physician Assistant

## 2019-09-07 NOTE — Progress Notes (Signed)
Left him a Vm to return my call.

## 2019-09-07 NOTE — Progress Notes (Signed)
Please let him know that his thyroid test was a little off.  Not bad, but this can sometimes happen when you take lithium. I was going to discuss w/ him at our appt, but I had to r/s. It won't hurt if we wait till appt in 3 weeks to discuss, but we'll need to start thyroid medicine. Then recheck lab in 4-6 weeks.  See if he wants to go ahead and start the med or wait to discuss w/ me at appt.  Thanks.

## 2019-09-08 NOTE — Progress Notes (Signed)
Left him a Vm to return my call.

## 2019-09-11 NOTE — Progress Notes (Signed)
Still unable to reach. Left him another VM to return my call.

## 2019-09-12 ENCOUNTER — Other Ambulatory Visit: Payer: Self-pay | Admitting: Physician Assistant

## 2019-09-12 MED ORDER — LEVOTHYROXINE SODIUM 25 MCG PO TABS: 25.0000 ug | ORAL_TABLET | Freq: Every day | ORAL | 0 refills | Status: DC

## 2019-09-12 NOTE — Progress Notes (Signed)
When you meet with him he would like to discuss labs from July as well. He stated he had them sent to you.

## 2019-09-12 NOTE — Progress Notes (Signed)
Rx sent in for Synthroid 25 mcg qd. No further action needed.

## 2019-09-12 NOTE — Progress Notes (Signed)
I'll talk w/ him at next OV. No need to keep calling.  Thanks

## 2019-09-12 NOTE — Progress Notes (Signed)
Nyshawn changed his mind and would like to go ahead and start the medication. Please send to [facility] [address]

## 2019-09-12 NOTE — Progress Notes (Signed)
Ok. Thanks!

## 2019-09-12 NOTE — Progress Notes (Signed)
He returned phone call this morning. Pt. Made aware and would like to wait and to speak with you before starting a new medication.

## 2019-09-28 ENCOUNTER — Telehealth: Payer: Self-pay

## 2019-09-28 NOTE — Telephone Encounter (Signed)
Pt. called to report he has been fatigued and feeling terrible. He decidied to try taking 2 of the Thyroid pills instead of one and states he feels better. He wanted me to let you know he would talk to you in length about it on Tuesday at his appt.

## 2019-09-28 NOTE — Telephone Encounter (Signed)
Ok

## 2019-10-03 ENCOUNTER — Ambulatory Visit (INDEPENDENT_AMBULATORY_CARE_PROVIDER_SITE_OTHER): Payer: BLUE CROSS/BLUE SHIELD | Admitting: Physician Assistant

## 2019-10-03 ENCOUNTER — Encounter: Payer: Self-pay | Admitting: Physician Assistant

## 2019-10-03 ENCOUNTER — Other Ambulatory Visit: Payer: Self-pay

## 2019-10-03 DIAGNOSIS — R5383 Other fatigue: Secondary | ICD-10-CM

## 2019-10-03 DIAGNOSIS — E032 Hypothyroidism due to medicaments and other exogenous substances: Secondary | ICD-10-CM

## 2019-10-03 DIAGNOSIS — F99 Mental disorder, not otherwise specified: Secondary | ICD-10-CM

## 2019-10-03 DIAGNOSIS — F5105 Insomnia due to other mental disorder: Secondary | ICD-10-CM

## 2019-10-03 DIAGNOSIS — F319 Bipolar disorder, unspecified: Secondary | ICD-10-CM

## 2019-10-03 MED ORDER — LEVOTHYROXINE SODIUM 50 MCG PO TABS: 50.0000 ug | ORAL_TABLET | Freq: Every day | ORAL | 1 refills | Status: DC

## 2019-10-03 NOTE — Progress Notes (Addendum)
[facility]  Patient ID: Erik Arroyo,  MRN: [id number]  PCP: Christain Sacramento, MD  Date of Evaluation: 10/03/2019 Time spent:30 minutes  Chief Complaint:  Chief Complaint    Follow-up; Other      HISTORY/CURRENT STATUS: HPI For routine med check.  We found that his TSH was slightly up since his LOV. On 09/12/19 he started the Synthroid.  "I took 2 b/c I thought it would make me feel better. I've been really tired. It helped right away.  But then I went back down to 1.  I was feeling worse.  So I called last week and you told me it is okay to take 2 pills.  I do not know whether I feel better or not right now."  Patient denies loss of interest in usual activities and is able to enjoy things.  Denies decreased energy or motivation.  Appetite has not changed.  No extreme sadness, tearfulness, or feelings of hopelessness.  Denies any changes in concentration, making decisions or remembering things.  Denies suicidal or homicidal thoughts.  Patient denies increased energy with decreased need for sleep, no increased talkativeness, no racing thoughts, no impulsivity or risky behaviors, no increased spending, no increased libido, no grandiosity.  Review of Systems  Constitutional: Positive for malaise/fatigue.  HENT: Negative.   Eyes: Negative.   Respiratory: Negative.   Cardiovascular: Negative.   Gastrointestinal: Negative.   Genitourinary: Negative.   Musculoskeletal: Negative.   Skin: Negative.   Neurological: Negative.   Endo/Heme/Allergies: Negative.   Psychiatric/Behavioral: Negative.    Individual Medical History/ Review of Systems: Changes? :Yes    Past medications for mental health diagnoses include: uncertain  Allergies: Penicillins  Current Medications:  Current Outpatient Medications:  .  fluticasone (FLONASE) 50 MCG/ACT nasal spray, Place 1 spray into both nostrils daily., Disp: , Rfl:  .  Fluticasone-Salmeterol (ADVAIR DISKUS) 250-50 MCG/DOSE AEPB,  Inhale 1 puff into the lungs 2 (two) times daily., Disp: , Rfl:  .  lithium 300 MG tablet, Take 5 tablets (1,500 mg total) by mouth at bedtime., Disp: 450 tablet, Rfl: 1 .  traZODone (DESYREL) 100 MG tablet, Take 1-3 tablets (100-300 mg total) by mouth at bedtime as needed for sleep. 3 tabs, Disp: 270 tablet, Rfl: 1 .  levothyroxine (SYNTHROID) 50 MCG tablet, Take 1 tablet (50 mcg total) by mouth daily before breakfast., Disp: 30 tablet, Rfl: 1 .  MOVIPREP 100 G SOLR, moviprep as directed. No substitutions (Patient not taking: Reported on 09/16/2018), Disp: 1 kit, Rfl: 0 Medication Side Effects: none  Family Medical/ Social History: Changes? No  MENTAL HEALTH EXAM:  There were no vitals taken for this visit.There is no height or weight on file to calculate BMI.  General Appearance: Casual, Neat, Well Groomed and Obese  Eye Contact:  Good  Speech:  Clear and Coherent  Volume:  Normal  Mood:  Euthymic  Affect:  Appropriate  Thought Process:  Goal Directed and Descriptions of Associations: Intact  Orientation:  Full (Time, Place, and Person)  Thought Content: Logical   Suicidal Thoughts:  No  Homicidal Thoughts:  No  Memory:  WNL  Judgement:  Good  Insight:  Good  Psychomotor Activity:  Normal  Concentration:  Concentration: Good  Recall:  Good  Fund of Knowledge: Good  Language: Good  Assets:  Desire for Improvement  ADL's:  Intact  Cognition: WNL  Prognosis:  Good  08/31/2019 TSH was slightly elevated at 6.27.  Lithium level was 0.6 BUN 18,  creatinine 1.0, glucose 95, the remainder of BMP was normal  DIAGNOSES:    ICD-10-CM   1. 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 Bipolar I disorder ([place])  F31.9   3. Fatigue, unspecified type  R53.83   4. Insomnia due to other mental disorder  F51.05    F99     Receiving Psychotherapy: No    RECOMMENDATIONS:  I spent 30 minutes with him. PDMP was reviewed. I explained the new diagnosis of hypothyroidism in detail.  We  discussed the treatment of levothyroxine and how this is a common diagnosis even in someone who is not on lithium.  However lithium is known to cause it.  It is not dangerous as long as we monitor the TSH.  We discussed the common symptoms.  He understands.  I have explained that taking a couple of pills of the Synthroid, he will likely feel better right away.  It may take 4 to 6 weeks.  I am fine with him taking the 50 mcg dose however and then we may need to change that dose depending on the next TSH.  He verbalizes understanding. Continue trazodone 100 mg, 1-3 p.o. nightly as needed sleep. Continue lithium 300 mg, 5 p.o. nightly. Continue Synthroid 50 mcg, 1 every morning before breakfast. TSH to be drawn in approximately 4 weeks. Our practice is out of network for his insurance.  Therefore I will not ask him to come back in for 4 months but if there are any problems he will have to be seen sooner.  He verbalizes understanding. Return in 4 months.  Donnal Moat, PA-C

## 2019-10-21 LAB — TSH: TSH: 5.35 mIU/L — ABNORMAL HIGH (ref 0.40–4.50)

## 2019-10-23 ENCOUNTER — Telehealth: Payer: Self-pay | Admitting: Physician Assistant

## 2019-10-23 NOTE — Telephone Encounter (Signed)
See lab note. Start Synthroid and repeat lab 6 weeks.  Will need his pharmacy and I'll order lab and have sent to him when I send in Rx.

## 2019-10-23 NOTE — Progress Notes (Signed)
TSH is still barely elevated.  Let's start Synthroid 25 mcg q am, and repeat level in 6 weeks.

## 2019-10-23 NOTE — Telephone Encounter (Signed)
Pt called checking status on lab results . Return call to pt @ [phone] this number not in system and don't change in system per pt.

## 2019-10-24 ENCOUNTER — Other Ambulatory Visit: Payer: Self-pay | Admitting: Physician Assistant

## 2019-10-24 DIAGNOSIS — E032 Hypothyroidism due to medicaments and other exogenous substances: Secondary | ICD-10-CM

## 2019-10-24 MED ORDER — LEVOTHYROXINE SODIUM 75 MCG PO TABS: 75.0000 ug | ORAL_TABLET | Freq: Every day | ORAL | 1 refills | Status: DC

## 2019-10-24 NOTE — Telephone Encounter (Signed)
I'm sorry for my mistake! He's been on current dose for 1 month. D/t fatigue and fact he's on Li, let's go ahead and increase Synthroid to 67mcg.  I'll send in Rx, need pharmacy.  I'll also order another TSH, which he should get in 6 weeks.  Also double check on the lab he will be going to, [facility]. we will mail that to him.

## 2019-10-24 NOTE — Telephone Encounter (Signed)
Left voicemail to call back to discuss labs and also need his pharmacy

## 2019-10-24 NOTE — Telephone Encounter (Signed)
You're right on the gradual increase of Synthroid. If we go too fast, it can end up causing hyperthyroidism and we do not want that either.  No need to call him back.

## 2019-10-24 NOTE — Progress Notes (Signed)
Patient aware of TSH level, advised to increase Synthroid 0.75 mcg. See phone message

## 2019-10-25 NOTE — Telephone Encounter (Signed)
Discussed the gradual increase with patient again, he's very anxious about feeling better that's why asking for a higher increase. Reassured him this was the best option and to give the medication time to work. Patient verbalized understanding.

## 2019-11-10 ENCOUNTER — Ambulatory Visit: Payer: Self-pay | Attending: Internal Medicine

## 2019-11-10 DIAGNOSIS — Z23 Encounter for immunization: Secondary | ICD-10-CM

## 2019-11-10 NOTE — Progress Notes (Signed)
   Covid-19 Vaccination Clinic  Name:  Erik Arroyo    MRN: [id number] DOB: [birth year]  11/10/2019  Erik Arroyo was observed post Covid-19 immunization for 15 minutes without incident. He was provided with Vaccine Information Sheet and instruction to access the V-Safe system.   Erik Arroyo was instructed to call 911 with any severe reactions post vaccine: Marland Kitchen Difficulty breathing  . Swelling of face and throat  . A fast heartbeat  . A bad rash all over body  . Dizziness and weakness   Immunizations Administered    Name Date Dose VIS Date Route   Pfizer COVID-19 Vaccine 11/10/2019  8:35 AM 0.3 mL 08/04/2019 Intramuscular   Manufacturer: [place]   Lot: [id number]   [place]: [id number]

## 2019-11-23 ENCOUNTER — Other Ambulatory Visit: Payer: Self-pay | Admitting: Physician Assistant

## 2019-12-05 ENCOUNTER — Ambulatory Visit: Payer: Self-pay | Attending: Internal Medicine

## 2019-12-05 DIAGNOSIS — Z23 Encounter for immunization: Secondary | ICD-10-CM

## 2019-12-05 NOTE — Progress Notes (Signed)
   Covid-19 Vaccination Clinic  Name:  Erik Arroyo    MRN: [id number] DOB: [birth year]  12/05/2019  Mr. Erik Arroyo was observed post Covid-19 immunization for 15 minutes without incident. He was provided with Vaccine Information Sheet and instruction to access the V-Safe system.   Mr. Erik Arroyo was instructed to call 911 with any severe reactions post vaccine: Marland Kitchen Difficulty breathing  . Swelling of face and throat  . A fast heartbeat  . A bad rash all over body  . Dizziness and weakness   Immunizations Administered    Name Date Dose VIS Date Route   Pfizer COVID-19 Vaccine 12/05/2019 10:16 AM 0.3 mL 08/04/2019 Intramuscular   Manufacturer: [place]   Lot: [id number]   [place]: [id number]

## 2019-12-07 ENCOUNTER — Other Ambulatory Visit: Payer: Self-pay

## 2019-12-07 LAB — TSH: TSH: 3.1 mIU/L (ref 0.40–4.50)

## 2019-12-07 MED ORDER — LITHIUM CARBONATE 300 MG PO TABS: 1500.0000 mg | ORAL_TABLET | Freq: Every day | ORAL | 1 refills | Status: DC

## 2019-12-07 MED ORDER — LEVOTHYROXINE SODIUM 75 MCG PO TABS: 75.0000 ug | ORAL_TABLET | Freq: Every day | ORAL | 5 refills | Status: DC

## 2019-12-07 NOTE — Progress Notes (Signed)
Please let him know the TSH is now in a good range.  Continue the current dose of levothyroxine.  Thanks

## 2020-02-01 ENCOUNTER — Ambulatory Visit: Payer: BLUE CROSS/BLUE SHIELD | Admitting: Physician Assistant

## 2020-05-28 ENCOUNTER — Other Ambulatory Visit: Payer: BLUE CROSS/BLUE SHIELD

## 2020-05-28 DIAGNOSIS — Z20822 Contact with and (suspected) exposure to covid-19: Secondary | ICD-10-CM

## 2020-05-29 LAB — NOVEL CORONAVIRUS, NAA: SARS-CoV-2, NAA: NOT DETECTED

## 2020-05-29 LAB — SARS-COV-2, NAA 2 DAY TAT

## 2020-08-31 ENCOUNTER — Ambulatory Visit: Payer: BLUE CROSS/BLUE SHIELD

## 2021-08-04 ENCOUNTER — Telehealth: Payer: Self-pay | Admitting: Physician Assistant

## 2021-08-04 NOTE — Telephone Encounter (Signed)
Pt called and wants to come back and see teresa. He has a new job and his new insurance will cover teresa need to know if you will see him again and how long do you need with him

## 2021-08-05 NOTE — Telephone Encounter (Signed)
Sure that's great.  30 minute appointment please.

## 2021-09-26 ENCOUNTER — Ambulatory Visit (HOSPITAL_BASED_OUTPATIENT_CLINIC_OR_DEPARTMENT_OTHER)
Admission: RE | Admit: 2021-09-26 | Discharge: 2021-09-26 | Disposition: A | Discharge status: Home / Self Care | Payer: BC Managed Care – PPO | Source: Ambulatory Visit | Attending: Orthopaedic Surgery | Admitting: Orthopaedic Surgery

## 2021-09-26 ENCOUNTER — Other Ambulatory Visit: Payer: Self-pay

## 2021-09-26 ENCOUNTER — Ambulatory Visit (INDEPENDENT_AMBULATORY_CARE_PROVIDER_SITE_OTHER): Payer: BC Managed Care – PPO | Admitting: Orthopaedic Surgery

## 2021-09-26 DIAGNOSIS — M25511 Pain in right shoulder: Secondary | ICD-10-CM | POA: Insufficient documentation

## 2021-09-26 DIAGNOSIS — M6520 Calcific tendinitis, unspecified site: Secondary | ICD-10-CM

## 2021-09-26 DIAGNOSIS — M65221 Calcific tendinitis, right upper arm: Secondary | ICD-10-CM

## 2021-09-26 MED ORDER — LIDOCAINE HCL 1 % IJ SOLN
4.0000 mL | INTRAMUSCULAR | Status: AC | PRN
  Administered 2021-09-26: 4 mL

## 2021-09-26 MED ORDER — TRIAMCINOLONE ACETONIDE 40 MG/ML IJ SUSP
80.0000 mg | INTRAMUSCULAR | Status: AC | PRN
  Administered 2021-09-26: 80 mg via INTRA_ARTICULAR

## 2021-09-26 NOTE — Progress Notes (Signed)
Chief Complaint: right shoulder pain     History of Present Illness:    Erik Arroyo is a 61 y.o. male "Erik Arroyo" presents today for follow-up of the right shoulder.  He is here today as he has had chronic right shoulder pain particularly with a backstroke type mechanism in the shoulder as he is an avid Academic librarian.  He states that at this point he experiences pain at the lateral aspect of the deltoid.  He describes tightness as well.  Denies any specific injury or incident.  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    Surgical History:   None  PMH/PSH/Family History/Social History/Meds/Allergies:    Past Medical History:  Diagnosis Date   Asthma    Tx. Advair daily   Past Surgical History:  Procedure Laterality Date   APPENDECTOMY  1977   COLONOSCOPY N/A 09/29/2013   Procedure: COLONOSCOPY;  Surgeon: Jerene Bears, MD;  Location: [facility];  Service: Gastroenterology;  Laterality: N/A;   TONSILLECTOMY  1969   Social History   Socioeconomic History   Marital status: Single    Spouse name: Not on file   Number of children: Not on file   Years of education: Not on file   Highest education level: Not on file  Occupational History   Not on file  Tobacco Use   Smoking status: Never   Smokeless tobacco: Never  Substance and Sexual Activity   Alcohol use: Yes    Alcohol/week: 3.0 standard drinks    Types: 3 Standard drinks or equivalent per week    Comment: only weekends   Drug use: No   Sexual activity: Not on file  Other Topics Concern   Not on file  Social History Narrative   Not on file   Social Determinants of Health   Financial Resource Strain: Not on file  Food Insecurity: Not on file  Transportation Needs: Not on file  Physical Activity: Not on file  Stress: Not on file  Social Connections: Not on file   Family History  Problem Relation Age of  Onset   Colon cancer Neg Hx    Allergies  Allergen Reactions   Penicillins Rash   Current Outpatient Medications  Medication Sig Dispense Refill   fluticasone (FLONASE) 50 MCG/ACT nasal spray Place 1 spray into both nostrils daily.     Fluticasone-Salmeterol (ADVAIR DISKUS) 250-50 MCG/DOSE AEPB Inhale 1 puff into the lungs 2 (two) times daily.     levothyroxine (SYNTHROID) 75 MCG tablet Take 1 tablet (75 mcg total) by mouth daily before breakfast. 30 tablet 5   lithium 300 MG tablet Take 5 tablets (1,500 mg total) by mouth at bedtime. 450 tablet 1   MOVIPREP 100 G SOLR moviprep as directed. No substitutions (Patient not taking: Reported on 09/16/2018) 1 kit 0   traZODone (DESYREL) 100 MG tablet TAKE 1-3 TABLETS BY MOUTH AT BEDTIME AS NEEDED FOR SLEEP 270 tablet 1   No current facility-administered medications for this visit.   No results found.  Review of Systems:   A ROS was performed including pertinent positives and negatives as documented in the HPI.  Physical Exam :   Constitutional: NAD and appears stated age Neurological: Alert and oriented Psych: Appropriate affect and cooperative There were  no vitals taken for this visit.   Comprehensive Musculoskeletal Exam:    Tenderness palpation about the lateral aspect of the shoulder and the greater tuberosity.  There is a equivocally positive Hornblower's although this is more painful than anything.  Negative belly press.  Full active forward elevation to 170 external rotation at side to 60 without weakness internal rotation is to L1 bilaterally  Imaging:   Xray (3 views right shoulder): Calcific tendinitis visualized in the rotator cuff tendon.  Otherwise normal    I personally reviewed and interpreted the radiographs.   Assessment:   61 year old male with right shoulder rotator cuff tendinitis.  At this time I would like to perform ultrasound-guided injection into this calcific area.  I did describe that should this  not give complete relief we would potentially consider an MRI to assess for any type of underlying tear  Plan :    -Right ultrasound steroid injection performed after verbal consent obtained    Procedure Note  Patient: Erik Arroyo             Date of Birth: September 10, 1960           MRN: [id number]             Visit Date: 09/26/2021  Procedures: Visit Diagnoses:  1. Right shoulder pain, unspecified chronicity     Large Joint Inj: R subacromial bursa on 09/26/2021 2:43 PM Indications: pain Details: 22 G 1.5 in needle, ultrasound-guided anterior approach  Arthrogram: No  Medications: 4 mL lidocaine 1 %; 80 mg triamcinolone acetonide 40 MG/ML Outcome: tolerated well, no immediate complications Procedure, treatment alternatives, risks and benefits explained, specific risks discussed. Consent was given by the patient. Immediately prior to procedure a time out was called to verify the correct patient, procedure, equipment, support staff and site/side marked as required. Patient was prepped and draped in the usual sterile fashion.          I personally saw and evaluated the patient, and participated in the management and treatment plan.  Vanetta Mulders, MD Attending Physician, Orthopedic Surgery  This document was dictated using Dragon voice recognition software. A reasonable attempt at proof reading has been made to minimize errors.

## 2021-10-20 ENCOUNTER — Ambulatory Visit (HOSPITAL_BASED_OUTPATIENT_CLINIC_OR_DEPARTMENT_OTHER): Payer: BC Managed Care – PPO | Admitting: Orthopaedic Surgery

## 2021-10-23 ENCOUNTER — Ambulatory Visit (HOSPITAL_BASED_OUTPATIENT_CLINIC_OR_DEPARTMENT_OTHER): Payer: BC Managed Care – PPO | Admitting: Orthopaedic Surgery

## 2021-10-24 ENCOUNTER — Ambulatory Visit (HOSPITAL_BASED_OUTPATIENT_CLINIC_OR_DEPARTMENT_OTHER): Payer: BC Managed Care – PPO | Admitting: Orthopaedic Surgery

## 2022-01-05 ENCOUNTER — Ambulatory Visit: Payer: BC Managed Care – PPO | Admitting: Physician Assistant

## 2022-04-25 DIAGNOSIS — R69 Illness, unspecified: Secondary | ICD-10-CM | POA: Diagnosis not present

## 2022-05-12 DIAGNOSIS — R69 Illness, unspecified: Secondary | ICD-10-CM | POA: Diagnosis not present

## 2022-05-12 DIAGNOSIS — Z6839 Body mass index (BMI) 39.0-39.9, adult: Secondary | ICD-10-CM | POA: Diagnosis not present

## 2022-05-12 DIAGNOSIS — J4520 Mild intermittent asthma, uncomplicated: Secondary | ICD-10-CM | POA: Diagnosis not present

## 2022-05-12 DIAGNOSIS — E6609 Other obesity due to excess calories: Secondary | ICD-10-CM | POA: Diagnosis not present

## 2022-05-12 DIAGNOSIS — E039 Hypothyroidism, unspecified: Secondary | ICD-10-CM | POA: Diagnosis not present

## 2022-05-21 ENCOUNTER — Other Ambulatory Visit: Payer: Self-pay

## 2022-05-21 ENCOUNTER — Emergency Department (HOSPITAL_BASED_OUTPATIENT_CLINIC_OR_DEPARTMENT_OTHER): Payer: 59

## 2022-05-21 ENCOUNTER — Encounter (HOSPITAL_BASED_OUTPATIENT_CLINIC_OR_DEPARTMENT_OTHER): Payer: Self-pay | Admitting: Emergency Medicine

## 2022-05-21 ENCOUNTER — Emergency Department (HOSPITAL_BASED_OUTPATIENT_CLINIC_OR_DEPARTMENT_OTHER)
Admission: EM | Admit: 2022-05-21 | Discharge: 2022-05-21 | Disposition: A | Discharge status: Home / Self Care | Payer: 59 | Attending: Emergency Medicine | Admitting: Emergency Medicine

## 2022-05-21 ENCOUNTER — Emergency Department (HOSPITAL_BASED_OUTPATIENT_CLINIC_OR_DEPARTMENT_OTHER): Payer: 59 | Admitting: Radiology

## 2022-05-21 DIAGNOSIS — Y92410 Unspecified street and highway as the place of occurrence of the external cause: Secondary | ICD-10-CM | POA: Insufficient documentation

## 2022-05-21 DIAGNOSIS — M25512 Pain in left shoulder: Secondary | ICD-10-CM | POA: Diagnosis not present

## 2022-05-21 DIAGNOSIS — R220 Localized swelling, mass and lump, head: Secondary | ICD-10-CM | POA: Diagnosis not present

## 2022-05-21 DIAGNOSIS — S7012XA Contusion of left thigh, initial encounter: Secondary | ICD-10-CM | POA: Insufficient documentation

## 2022-05-21 DIAGNOSIS — Z041 Encounter for examination and observation following transport accident: Secondary | ICD-10-CM | POA: Diagnosis not present

## 2022-05-21 DIAGNOSIS — S4992XA Unspecified injury of left shoulder and upper arm, initial encounter: Secondary | ICD-10-CM | POA: Diagnosis not present

## 2022-05-21 DIAGNOSIS — S40012A Contusion of left shoulder, initial encounter: Secondary | ICD-10-CM | POA: Diagnosis not present
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
## 2022-06-03 IMAGING — DX DG SHOULDER 2+V*R*
3 series · 3 of 3 positions shown · non-contrast
Comparison: None.

CLINICAL DATA: Chronic right shoulder pain.  No known injury.

EXAM:
RIGHT SHOULDER - 2+ VIEW

[shoulder grashey]
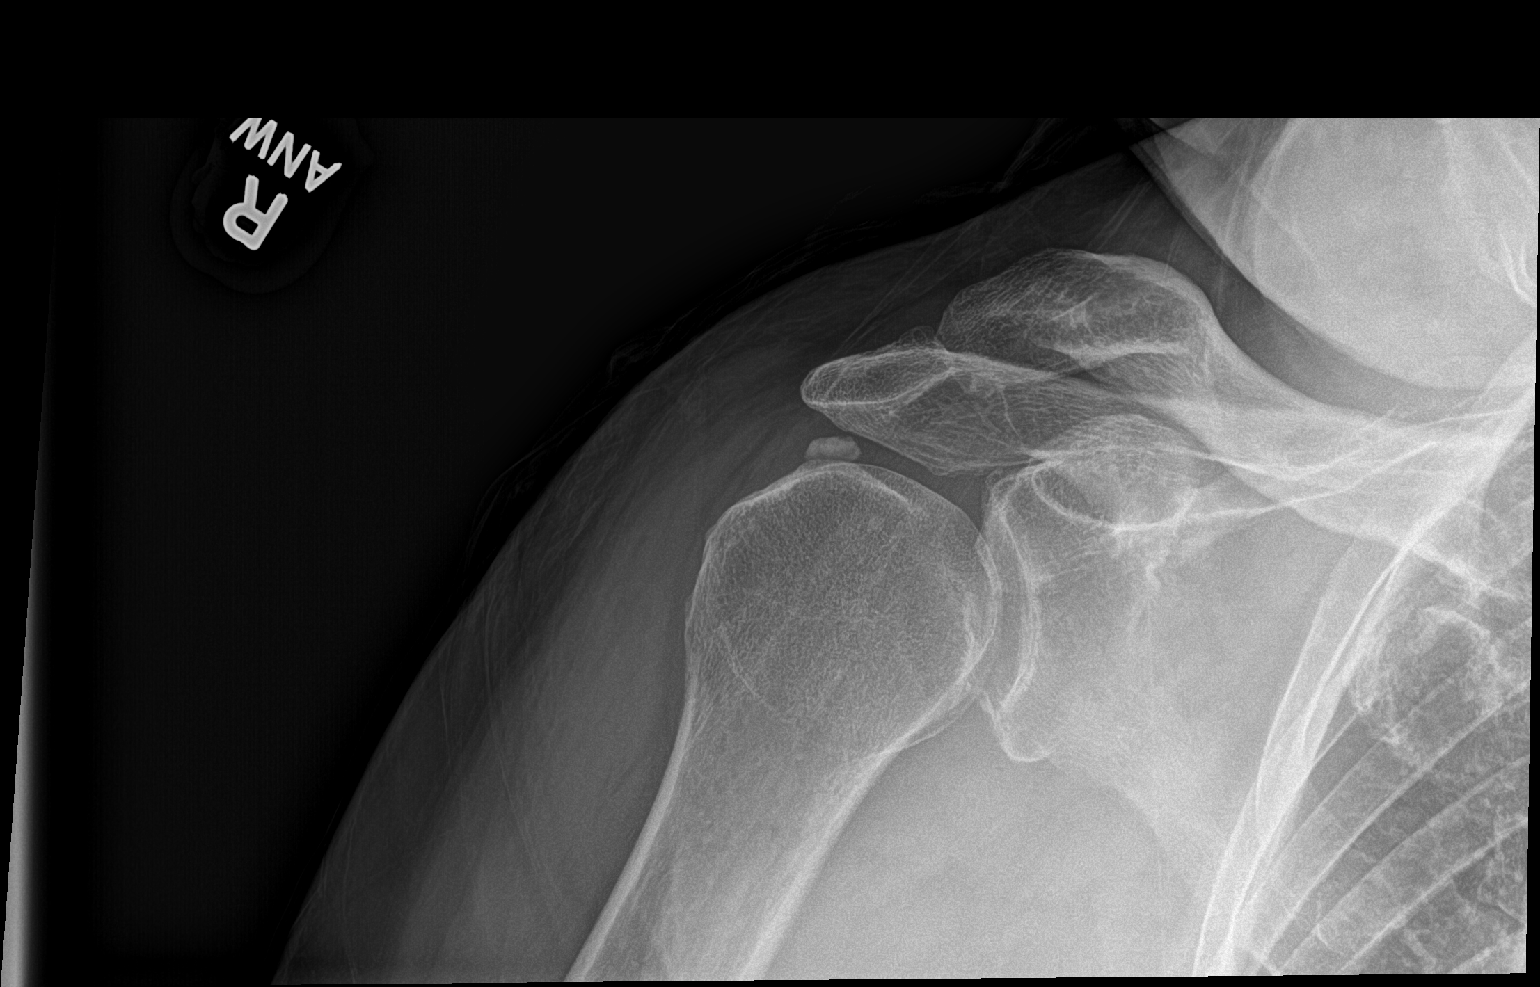

[shoulder y view]
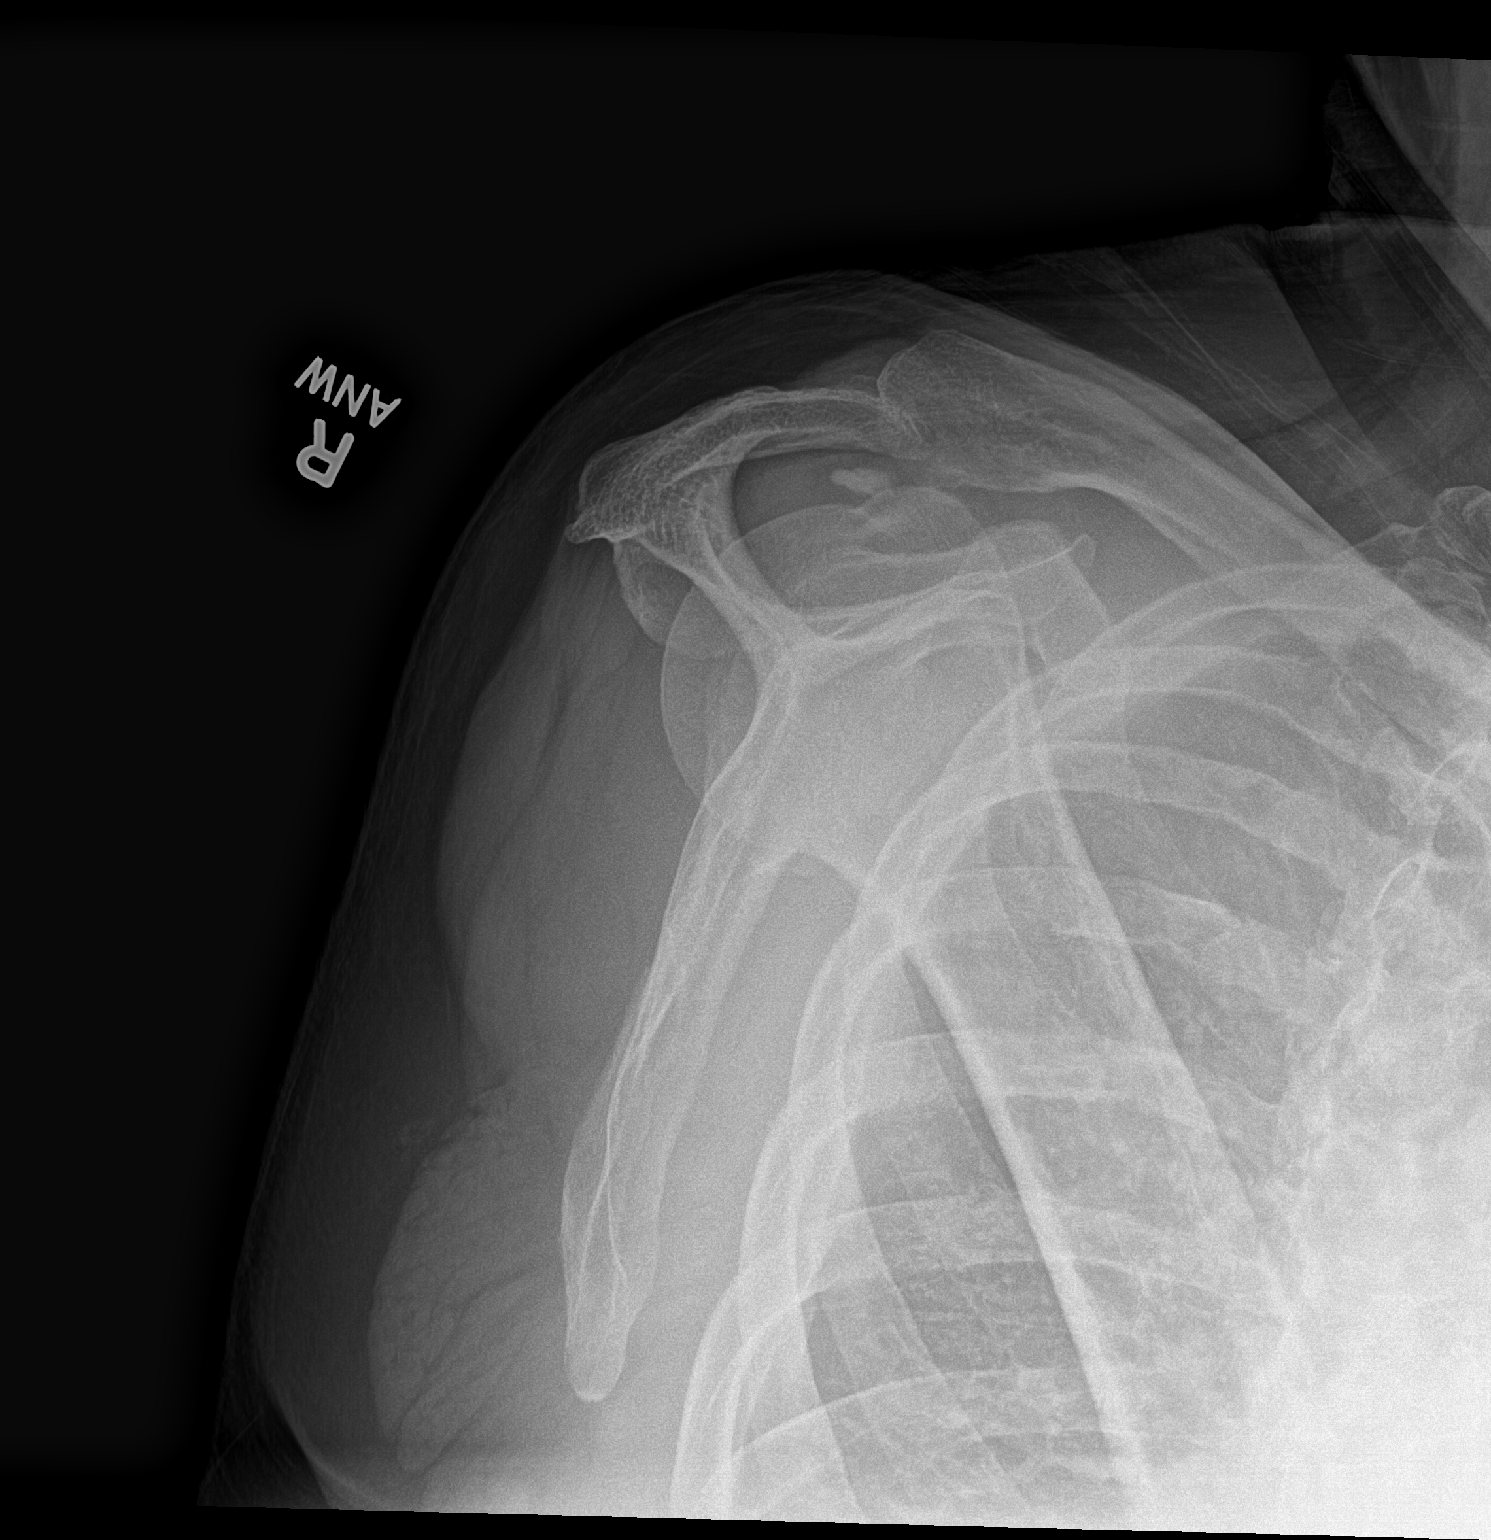

[shoulder axillary]
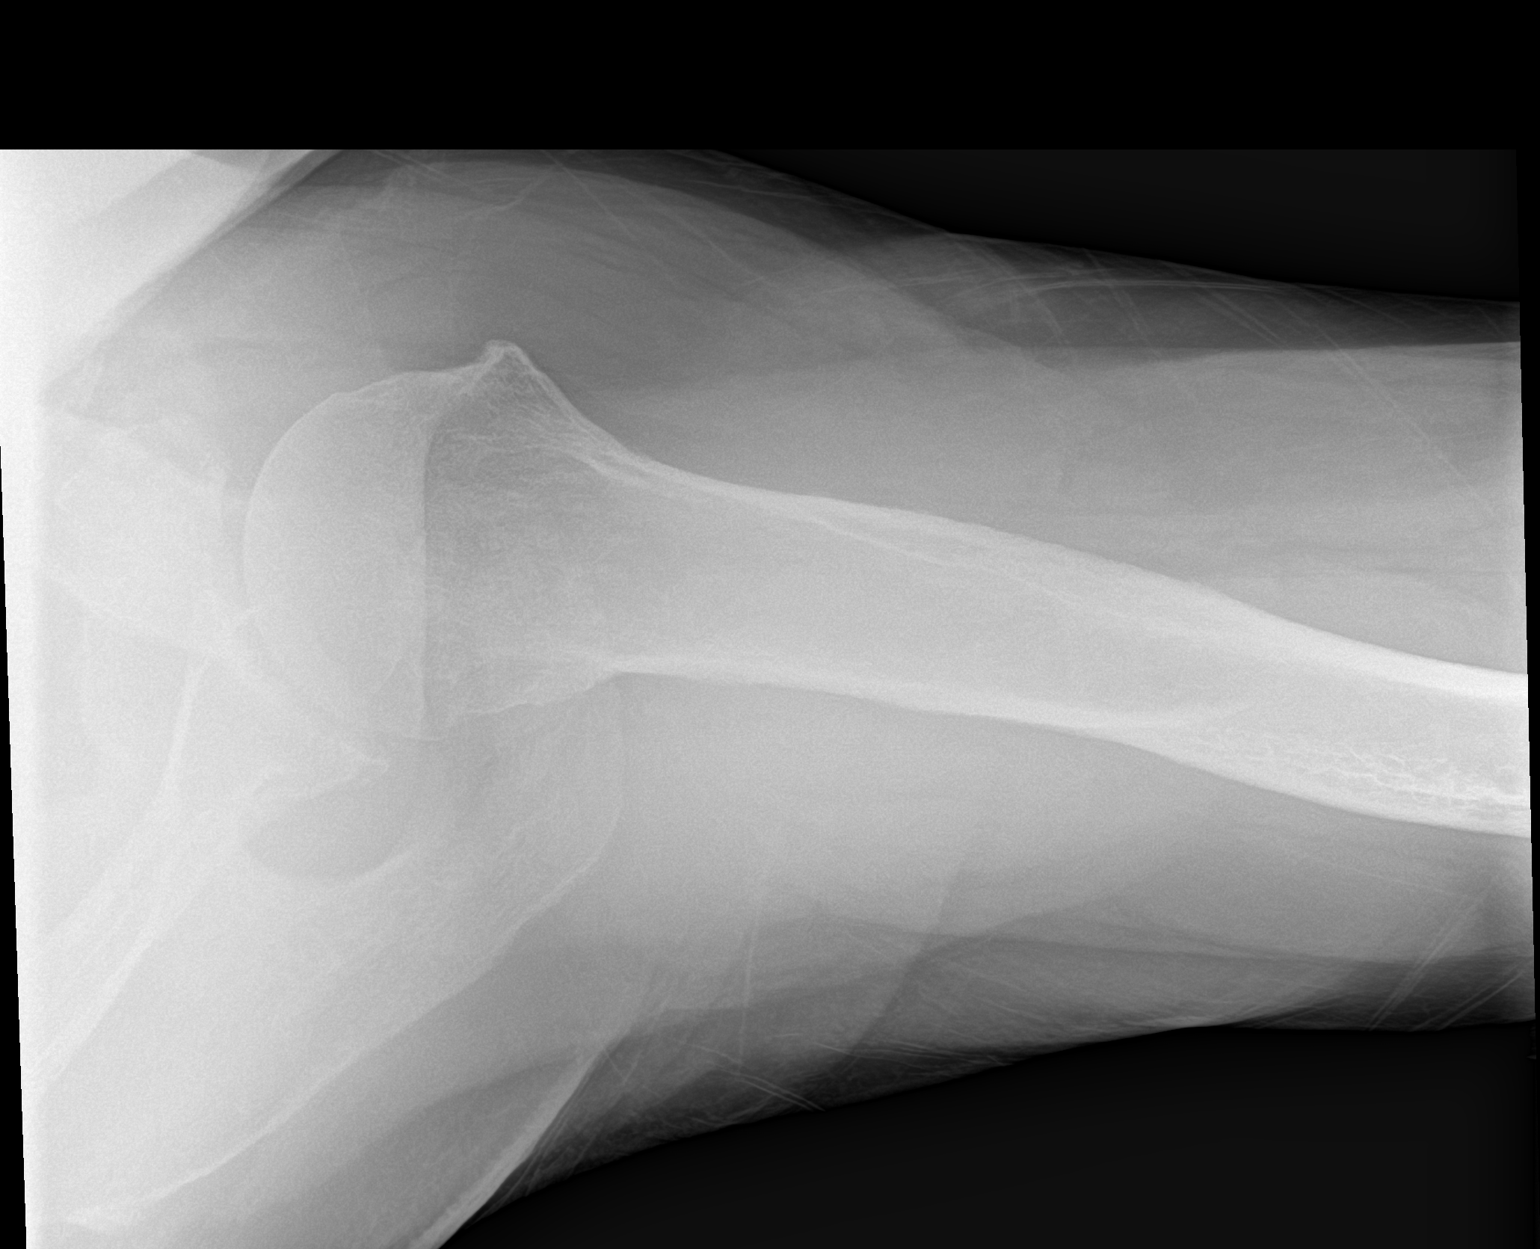

[3 of 3 positions shown; findings below may reference images not displayed]

FINDINGS: There is mildly decreased bone mineralization. Mild glenohumeral
joint space narrowing and mild peripheral glenoid degenerative
osteophytosis. Moderate acromioclavicular joint space narrowing with
mild peripheral degenerative osteophytosis. There is a calcific
density measuring up to 10 x 5 mm (transverse by craniocaudal) just
superior to the anterior lateral aspect of the humeral head likely
chronic calcific tendinosis of the superior rotator cuff. This
appears to be in the region of the supraspinatus tendon.

No acute fracture or dislocation.
IMPRESSION: Mild-to-moderate glenohumeral and acromioclavicular osteoarthritis.

Chronic calcific tendinosis of the superior rotator cuff, likely the
supraspinatus.

## 2022-09-24 DIAGNOSIS — Z419 Encounter for procedure for purposes other than remedying health state, unspecified: Secondary | ICD-10-CM | POA: Diagnosis not present

## 2022-10-23 DIAGNOSIS — Z419 Encounter for procedure for purposes other than remedying health state, unspecified: Secondary | ICD-10-CM | POA: Diagnosis not present

## 2022-11-23 DIAGNOSIS — Z419 Encounter for procedure for purposes other than remedying health state, unspecified: Secondary | ICD-10-CM | POA: Diagnosis not present

## 2022-12-23 DIAGNOSIS — Z419 Encounter for procedure for purposes other than remedying health state, unspecified: Secondary | ICD-10-CM | POA: Diagnosis not present

## 2023-01-23 DIAGNOSIS — Z419 Encounter for procedure for purposes other than remedying health state, unspecified: Secondary | ICD-10-CM | POA: Diagnosis not present

## 2023-02-03 ENCOUNTER — Encounter: Payer: Self-pay | Admitting: Physician Assistant

## 2023-02-03 ENCOUNTER — Ambulatory Visit (INDEPENDENT_AMBULATORY_CARE_PROVIDER_SITE_OTHER): Payer: 59 | Admitting: Physician Assistant

## 2023-02-03 VITALS — BP 127/70 | HR 57 | Ht 78.0 in | Wt 364.8 lb

## 2023-02-03 DIAGNOSIS — Z79899 Other long term (current) drug therapy: Secondary | ICD-10-CM | POA: Diagnosis not present

## 2023-02-03 DIAGNOSIS — F319 Bipolar disorder, unspecified: Secondary | ICD-10-CM

## 2023-02-03 DIAGNOSIS — E032 Hypothyroidism due to medicaments and other exogenous substances: Secondary | ICD-10-CM

## 2023-02-03 NOTE — Progress Notes (Signed)
[facility] MD/PA/NP Initial Note  02/03/2023 2:01 PM Erik Arroyo  MRN:  [id number]  Chief Complaint:  Chief Complaint   Establish Care    HPI:  Former patient of mine, transferring back to me after his psychiatrist moved [facility]. Last seen 10/03/2019, had to change d/t out of insurance network. Now in network. He needs to re-establish care.  Mental health has been stable for awhile. His nephew was killed in a car wreck in 2022 and he was of course sad during that time, saw a Veterinary surgeon for awhile, which helped. He liked the counselor but was out of network.  Patient is able to enjoy things.  Energy and motivation are good. Feels sad, ever since he turned 62.  States he was not concerned one way or another about turning 62 so does not feel like he had any preconceived notions or anything.  He is able to brighten up without difficulty though, he will just get emotional about things fairly easily.  Sleeps well most of the time. Does have dreams a lot. When his nephew died, it was a lot worse.  ADLs and personal hygiene are normal.   Denies any changes in concentration, making decisions, or remembering things.  Appetite has not changed.  Weight is stable.  He is trying to lose weight but having a hard time.  Work is going ok.  No complaints of anxiety.  Denies suicidal or homicidal thoughts.  Patient denies increased energy with decreased need for sleep, increased talkativeness, racing thoughts, impulsivity or risky behaviors, increased spending, increased libido, grandiosity, increased irritability or anger, paranoia, or hallucinations.  Visit Diagnosis:    ICD-10-CM   1. 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 Encounter for long-term (current) use of medications  Z79.899 Lithium level    Comprehensive metabolic panel    TSH     Past Psychiatric History:   Past medications for  mental health diagnoses include: Lithium, Seroquel   Past Medical History:  Past Medical History:  Diagnosis Date   Asthma    Tx. Advair daily   Bipolar disorder [facility])    Thyroid disease     Past Surgical History:  Procedure Laterality Date   APPENDECTOMY  1977   COLONOSCOPY N/A 09/29/2013   Procedure: COLONOSCOPY;  Surgeon: Beverley Fiedler, MD;  Location: [facility];  Service: Gastroenterology;  Laterality: N/A;   TONSILLECTOMY  1969   Family Psychiatric History:  See below  Family History:  Family History  Problem Relation Age of Onset   Diabetes Mellitus II Mother    Heart disease Mother    Parkinson's disease Father    Obesity Father    Heart disease Brother    Obesity Brother    Colon cancer Neg Hx    Social History:  Social History   Socioeconomic History   Marital status: Single    Spouse name: Not on file   Number of children: Not on file   Years of education: Not on file   Highest education level: Not on file  Occupational History   Not on file  Tobacco Use   Smoking status: Never   Smokeless tobacco: Never  Substance and Sexual Activity   Alcohol use: Not Currently    Comment: quit 09/2019   Drug use: No   Sexual activity: Not on file  Other Topics Concern   Not on file  Social History Narrative   Active in church, works for courier service, has GF, gets together with friends occas. He lives alone. Mom is in assisted living.       No legal issues   Social Determinants of Health   Financial Resource Strain: Low Risk  (02/03/2023)   Overall Financial Resource Strain (CARDIA)    Difficulty of Paying Living Expenses: Not very hard  Food Insecurity: No Food Insecurity (02/03/2023)   Hunger Vital Sign    Worried About Running Out of Food in the Last Year: Never true    Ran Out of Food in the Last Year: Never true  Transportation Needs: No Transportation Needs (02/03/2023)   PRAPARE - Administrator, Civil Service (Medical): No    Lack of  Transportation (Non-Medical): No  Physical Activity: Insufficiently Active (02/03/2023)   Exercise Vital Sign    Days of Exercise per Week: 4 days    Minutes of Exercise per Session: 30 min  Stress: Stress Concern Present (02/03/2023)   [employer] of Occupational Health - Occupational Stress Questionnaire    Feeling of Stress : To some extent  Social Connections: Moderately Isolated (02/03/2023)   Social Connection and Isolation Panel [NHANES]    Frequency of Communication with Friends and Family: Twice a week    Frequency of Social Gatherings with Friends and Family: Twice a week    Attends Religious Services: More than 4 times per year    Active Member of [employer] or Organizations: No    Attends Banker Meetings: Never    Marital Status: Never married   Allergies:  Allergies  Allergen Reactions   Penicillins Rash   Metabolic Disorder Labs: Lab Results  Component Value Date   HGBA1C 5.7 (H) 04/20/2011   MPG 117 (H) 04/20/2011   No results found for: "PROLACTIN" Lab Results  Component Value Date   CHOL 212 (H) 04/21/2011   TRIG 231 (H) 04/21/2011   HDL 36 (L) 04/21/2011   CHOLHDL 5.9 04/21/2011   VLDL 46 (H) 04/21/2011   LDLCALC 130 (H) 04/21/2011   Lab Results  Component Value Date   TSH 3.10 12/06/2019   TSH 5.35 (H) 10/20/2019    Therapeutic Level Labs: Lab Results  Component Value Date   LITHIUM 0.6 08/31/2019   LITHIUM 0.4 (L) 09/16/2018   No results found for: "VALPROATE" No results found for: "CBMZ"  Current Medications: Current Outpatient Medications  Medication Sig Dispense Refill   levothyroxine (SYNTHROID) 75 MCG tablet Take 1 tablet (75 mcg total) by mouth daily before breakfast. (Patient taking differently: Take 100 mcg by mouth daily before breakfast.) 30 tablet 5   lithium 300 MG tablet Take 5 tablets (1,500 mg total) by mouth at bedtime. 450 tablet 1   traZODone (DESYREL) 100 MG tablet TAKE 1-3 TABLETS BY MOUTH AT BEDTIME AS  NEEDED FOR SLEEP 270 tablet 1   fluticasone (FLONASE) 50 MCG/ACT nasal spray Place 1 spray into both nostrils daily. (Patient not taking: Reported on 02/03/2023)     Fluticasone-Salmeterol (ADVAIR DISKUS) 250-50 MCG/DOSE AEPB Inhale 1 puff into the lungs 2 (two) times daily. (Patient not taking: Reported on 02/03/2023)     MOVIPREP 100 G SOLR moviprep as directed. No substitutions (Patient not taking: Reported on 09/16/2018) 1 kit 0   No current facility-administered medications for this visit.   Medication Side Effects: none  Orders placed this visit:   Orders Placed This Encounter  Procedures   Lithium level   Comprehensive metabolic panel   TSH    Psychiatric Specialty Exam:  Review of Systems  Constitutional: Negative.   HENT: Negative.    Eyes: Negative.   Respiratory: Negative.    Cardiovascular: Negative.   Gastrointestinal: Negative.   Endocrine: Negative.   Genitourinary: Negative.   Musculoskeletal: Negative.   Skin: Negative.   Allergic/Immunologic: Negative.   Neurological: Negative.   Hematological: Negative.   Psychiatric/Behavioral:         See HPI    Blood pressure 127/70, pulse (!) 57, height 6\' 6"  (1.981 m), weight (!) 364 lb 12.8 oz (165.5 kg).Body mass index is 42.16 kg/m.  General Appearance: Casual, Well Groomed, and Obese  Eye Contact:  Good  Speech:  Clear and Coherent and Normal Rate  Volume:  Normal  Mood:  Euthymic  Affect:  Congruent  Thought Process:  Goal Directed and Descriptions of Associations: Circumstantial  Orientation:  Full (Time, Place, and Person)  Thought Content: Logical   Suicidal Thoughts:  No  Homicidal Thoughts:  No  Memory:  WNL  Judgement:  Good  Insight:  Good  Psychomotor Activity:  Normal  Concentration:  Concentration: Good  Recall:  Good  Fund of Knowledge: Good  Language: Good  Assets:  Desire for Improvement Financial Resources/Insurance Housing Transportation Vocational/Educational  ADL's:  Intact   Cognition: WNL  Prognosis:  Good   Labs 04/25/2022 Lithium level 0.4  09/11/2022 TSH 1.6  Screenings:  PHQ2-9    Flowsheet Row Counselor from 02/03/2023 in [facility]  PHQ-2 Total Score 1      Flowsheet Row ED from [facility]/21/2022 in [facility] Department at [facility]  C-SSRS RISK CATEGORY No Risk      Receiving Psychotherapy: No   Treatment Plan/Recommendations:  PDMP reviewed.  No recent controlled substances. I provided 60 minutes of face to face time during this encounter, including time spent before and after the visit in records review, medical decision making, counseling pertinent to today's visit, and charting.   He is stable as far as his mental health goes.  I recommend no changing in the lithium.  He has enough lithium for another 6 months, does not need prescription right now.  His PCP prescribes the levothyroxine.  Continue levothyroxine 100 mcg, 1 p.o. every morning. Continue lithium 300 mg, 5 p.o. nightly. Continue trazodone 100 mg, 1-3 nightly as needed sleep. Recommend he get back in with a counselor. Labs ordered as noted above.  He needs to have them drawn within the next 3 months.  He verbalizes understanding. Return in 5 months, before he runs out of his medications.  Melony Overly, PA-C

## 2023-02-22 DIAGNOSIS — Z419 Encounter for procedure for purposes other than remedying health state, unspecified: Secondary | ICD-10-CM | POA: Diagnosis not present

## 2023-03-25 DIAGNOSIS — Z419 Encounter for procedure for purposes other than remedying health state, unspecified: Secondary | ICD-10-CM | POA: Diagnosis not present

## 2023-04-15 LAB — COMPREHENSIVE METABOLIC PANEL: Total Protein: 6.4 g/dL (ref 6.1–8.1)

## 2023-04-15 NOTE — Progress Notes (Signed)
If he is doing well with mood, even though Li level is low, we'll leave the dose the same. If having more depression or mood swings, we will need to increase the lithium to 1800 mg daily.  His kidney function and TSH are normal.  Blood sugar is 108 which is not concerning.

## 2023-04-15 NOTE — Progress Notes (Signed)
Noted  

## 2023-04-25 DIAGNOSIS — Z419 Encounter for procedure for purposes other than remedying health state, unspecified: Secondary | ICD-10-CM | POA: Diagnosis not present

## 2023-05-25 DIAGNOSIS — Z419 Encounter for procedure for purposes other than remedying health state, unspecified: Secondary | ICD-10-CM | POA: Diagnosis not present

## 2023-06-25 DIAGNOSIS — Z419 Encounter for procedure for purposes other than remedying health state, unspecified: Secondary | ICD-10-CM | POA: Diagnosis not present

## 2023-07-09 ENCOUNTER — Ambulatory Visit: Payer: 59 | Admitting: Physician Assistant

## 2023-07-13 ENCOUNTER — Encounter: Payer: Self-pay | Admitting: Physician Assistant

## 2023-07-13 ENCOUNTER — Ambulatory Visit (INDEPENDENT_AMBULATORY_CARE_PROVIDER_SITE_OTHER): Payer: 59 | Admitting: Physician Assistant

## 2023-07-13 DIAGNOSIS — F319 Bipolar disorder, unspecified: Secondary | ICD-10-CM

## 2023-07-13 DIAGNOSIS — T56891A Toxic effect of other metals, accidental (unintentional), initial encounter: Secondary | ICD-10-CM

## 2023-07-13 DIAGNOSIS — E032 Hypothyroidism due to medicaments and other exogenous substances: Secondary | ICD-10-CM

## 2023-07-13 MED ORDER — TRAZODONE HCL 100 MG PO TABS: 300.0000 mg | ORAL_TABLET | Freq: Every evening | ORAL | 5 refills | Status: DC | PRN

## 2023-07-13 MED ORDER — LITHIUM CARBONATE 300 MG PO TABS: 1500.0000 mg | ORAL_TABLET | Freq: Every day | ORAL | 5 refills | Status: DC

## 2023-07-13 MED ORDER — LEVOTHYROXINE SODIUM 100 MCG PO TABS: 100.0000 ug | ORAL_TABLET | Freq: Every day | ORAL | 5 refills | Status: DC

## 2023-07-13 NOTE — Progress Notes (Signed)
[facility]  Patient ID: Erik Arroyo,  MRN: [id number]  PCP: Barbie Banner, MD  Date of Evaluation: 07/13/2023 Time spent: 31 minutes  Chief Complaint:  Chief Complaint   Follow-up    HISTORY/CURRENT STATUS: HPI For routine med check.   Doing well. Meds are working as intended. Patient denies increased energy with decreased need for sleep, increased talkativeness, racing thoughts, impulsivity or risky behaviors, increased spending, increased libido, grandiosity, increased irritability or anger, paranoia, or hallucinations.  Patient is able to enjoy things.  Energy and motivation are good.  Work is going well.   No extreme sadness, tearfulness, or feelings of hopelessness.  Sleeps well most of the time. ADLs and personal hygiene are normal.   Denies any changes in concentration, making decisions, or remembering things.  Appetite has not changed.  Weight is stable.  Anxious about finances.  His job doesn't pay well, not enough to pay the bills, so he ends up 'going backwards' all the time.  Has trying to find something else.  Denies suicidal or homicidal thoughts.  Complains of mild tremor in his hands bilat, doesn't affect his ADLs or work.  Denies dizziness, syncope, seizures, numbness, tingling, tics, unsteady gait, slurred speech, confusion. Denies muscle or joint pain, stiffness, or dystonia.  Individual Medical History/ Review of Systems: Changes? :No   Past medications for mental health diagnoses include: Lithium, Seroquel  Allergies: Penicillins  Current Medications:  Current Outpatient Medications:    fluticasone (FLONASE) 50 MCG/ACT nasal spray, Place 1 spray into both nostrils daily., Disp: , Rfl:    Fluticasone-Salmeterol (ADVAIR DISKUS) 250-50 MCG/DOSE AEPB, Inhale 1 puff into the lungs 2 (two) times daily., Disp: , Rfl:    levothyroxine (SYNTHROID) 100 MCG tablet, Take 1 tablet (100 mcg total) by mouth daily before breakfast., Disp: 30 tablet, Rfl:  5   magnesium 30 MG tablet, Take 30 mg by mouth every evening., Disp: , Rfl:    lithium 300 MG tablet, Take 5 tablets (1,500 mg total) by mouth at bedtime., Disp: 150 tablet, Rfl: 5   MOVIPREP 100 G SOLR, moviprep as directed. No substitutions (Patient not taking: Reported on 09/16/2018), Disp: 1 kit, Rfl: 0   traZODone (DESYREL) 100 MG tablet, Take 3 tablets (300 mg total) by mouth at bedtime as needed for sleep., Disp: 90 tablet, Rfl: 5 Medication Side Effects: none  Family Medical/ Social History: Changes? No  MENTAL HEALTH EXAM:  There were no vitals taken for this visit.There is no height or weight on file to calculate BMI.  General Appearance: Casual, Well Groomed, and Obese  Eye Contact:  Good  Speech:  Clear and Coherent and Normal Rate  Volume:  Normal  Mood:  Euthymic  Affect:  Congruent  Thought Process:  Goal Directed and Descriptions of Associations: Circumstantial  Orientation:  Full (Time, Place, and Person)  Thought Content: Logical   Suicidal Thoughts:  No  Homicidal Thoughts:  No  Memory:  WNL  Judgement:  Good  Insight:  Good  Psychomotor Activity:  Normal  Concentration:  Concentration: Good  Recall:  Good  Fund of Knowledge: Good  Language: Good  Assets:  Communication Skills Desire for Improvement Financial Resources/Insurance Housing Transportation Vocational/Educational  ADL's:  Intact  Cognition: WNL  Prognosis:  Good   Labs 04/14/2023 TSH 1.6 CMP kidney functions are normal, glucose 108.  All other results are normal. Lithium level 0.5  DIAGNOSES:    ICD-10-CM   1. 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 No recent controlled substances.  I provided 31 minutes of face to face time during this encounter, including time spent before and after the visit in records review, medical decision making, counseling pertinent to  today's visit, and charting.   We discussed the low lithium level from 3 months ago.  He is doing well as far as his mood goes so we agree not to increase the dose.  He is a tall overweight guy and he feels like those parameters are for someone half his size.  I reminded him we treat him and his symptoms, not the lab values.  Since he is doing well no med changes needed.   Continue Levothyroxine 100 mcg, 1 q am. Continue Lithium 300 mg, 5 at bedtime.  Continue Trazodone 100 mg, 3 at bedtime prn Return in 6 months.   Melony Overly, PA-C

## 2023-07-25 DIAGNOSIS — Z419 Encounter for procedure for purposes other than remedying health state, unspecified: Secondary | ICD-10-CM | POA: Diagnosis not present

## 2023-08-18 ENCOUNTER — Other Ambulatory Visit: Payer: Self-pay | Admitting: Physician Assistant

## 2023-08-20 NOTE — Telephone Encounter (Signed)
REFUSED RQST FOR 90 DAY RF DT LAST WRITTEN DATE 11/19 WITH 5 RF.

## 2023-08-25 DIAGNOSIS — Z419 Encounter for procedure for purposes other than remedying health state, unspecified: Secondary | ICD-10-CM | POA: Diagnosis not present

## 2023-09-02 ENCOUNTER — Telehealth: Payer: Self-pay | Admitting: Physician Assistant

## 2023-09-02 NOTE — Telephone Encounter (Signed)
 From last resume 8/22.  If he is doing well with mood, even though Li level is low, we'll leave the dose the same. If having more depression or mood swings, we will need to increase the lithium  to 1800 mg daily.  Patient left message that he is now taking 6 a day and needs a RF.   Have tried twice to reach patient, but have not been able to talk to him. RF sent for 6 a day.

## 2023-09-02 NOTE — Telephone Encounter (Signed)
 Patient called in regarding his lithium  prescription. States that TH advised him that he could starting taking up to six pills a day. He has been doing that but says that he will be out soon. He needs new prescription sent for the six a day. Ph: [phone] Appt 5/20

## 2023-09-02 NOTE — Telephone Encounter (Signed)
 LVM to [facility]

## 2023-09-03 MED ORDER — LITHIUM CARBONATE 300 MG PO TABS: 1800.0000 mg | ORAL_TABLET | Freq: Every day | ORAL | 5 refills | Status: DC

## 2023-09-03 NOTE — Telephone Encounter (Signed)
 Rx sent for lithium 6 a day (1800 mg) to [facility]

## 2023-09-25 DIAGNOSIS — Z419 Encounter for procedure for purposes other than remedying health state, unspecified: Secondary | ICD-10-CM | POA: Diagnosis not present

## 2023-10-23 DIAGNOSIS — Z419 Encounter for procedure for purposes other than remedying health state, unspecified: Secondary | ICD-10-CM | POA: Diagnosis not present

## 2023-12-04 DIAGNOSIS — Z419 Encounter for procedure for purposes other than remedying health state, unspecified: Secondary | ICD-10-CM | POA: Diagnosis not present

## 2024-01-03 DIAGNOSIS — Z419 Encounter for procedure for purposes other than remedying health state, unspecified: Secondary | ICD-10-CM | POA: Diagnosis not present

## 2024-01-11 ENCOUNTER — Other Ambulatory Visit: Payer: Self-pay | Admitting: Physician Assistant

## 2024-01-11 ENCOUNTER — Encounter: Payer: Self-pay | Admitting: Physician Assistant

## 2024-01-11 ENCOUNTER — Ambulatory Visit (INDEPENDENT_AMBULATORY_CARE_PROVIDER_SITE_OTHER): Payer: 59 | Admitting: Physician Assistant

## 2024-01-11 DIAGNOSIS — E032 Hypothyroidism due to medicaments and other exogenous substances: Secondary | ICD-10-CM | POA: Diagnosis not present

## 2024-01-11 DIAGNOSIS — F99 Mental disorder, not otherwise specified: Secondary | ICD-10-CM | POA: Diagnosis not present

## 2024-01-11 DIAGNOSIS — F319 Bipolar disorder, unspecified: Secondary | ICD-10-CM

## 2024-01-11 DIAGNOSIS — F5105 Insomnia due to other mental disorder: Secondary | ICD-10-CM

## 2024-01-11 MED ORDER — LITHIUM CARBONATE 300 MG PO TABS: 1800.0000 mg | ORAL_TABLET | Freq: Every day | ORAL | 5 refills | Status: DC

## 2024-01-11 MED ORDER — TRAZODONE HCL 100 MG PO TABS: 300.0000 mg | ORAL_TABLET | Freq: Every evening | ORAL | 5 refills | Status: DC | PRN

## 2024-01-11 NOTE — Progress Notes (Signed)
 [facility]  Patient ID: Erik Arroyo,  MRN: [id number]  PCP: Tura Gaines, MD  Date of Evaluation: 01/11/2024 Time spent:30 minutes  Chief Complaint:  Chief Complaint   Depression; Insomnia; Follow-up    HISTORY/CURRENT STATUS: HPI For routine med check.   Doing ok.  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  Energy and motivation are good. No extreme sadness, tearfulness, or feelings of hopelessness.  Sleeps well most of the time but needs the Trazodone  or he has trouble falling asleep and staying asleep.  ADLs and personal hygiene are normal.   Denies any changes in concentration, making decisions, or remembering things.  Appetite has not changed.  Weight is stable. He has cut out a lot of sugar from his diet.  Denies suicidal or homicidal thoughts.  Patient denies increased energy with decreased need for sleep, increased talkativeness, racing thoughts, impulsivity or risky behaviors, increased spending, increased libido, grandiosity, increased irritability or anger, paranoia, or hallucinations.  Denies dizziness, syncope, seizures, numbness, tingling, tremor, tics, unsteady gait, slurred speech, confusion. Denies muscle or joint pain, stiffness, or dystonia.Denies unexplained weight loss, frequent infections, or sores that heal slowly.  No polyphagia, polydipsia, or polyuria. Denies visual changes or paresthesias.   Individual Medical History/ Review of Systems: Changes? :No   Past medications for mental health diagnoses include: Lithium , Seroquel  Allergies: Penicillins  Current Medications:  Current Outpatient Medications:    fluticasone (FLONASE) 50 MCG/ACT nasal spray, Place 1 spray into both nostrils daily., Disp: , Rfl:    Fluticasone-Salmeterol (ADVAIR DISKUS) 250-50 MCG/DOSE AEPB, Inhale 1 puff into the lungs 2 (two) times daily., Disp: , Rfl:     levothyroxine  (SYNTHROID ) 100 MCG tablet, Take 1 tablet (100 mcg total) by mouth daily before breakfast., Disp: 30 tablet, Rfl: 5   magnesium 30 MG tablet, Take 30 mg by mouth every evening., Disp: , Rfl:    lithium  300 MG tablet, Take 6 tablets (1,800 mg total) by mouth at bedtime., Disp: 180 tablet, Rfl: 5   MOVIPREP  100 G SOLR, moviprep  as directed. No substitutions (Patient not taking: Reported on 09/16/2018), Disp: 1 kit, Rfl: 0   traZODone  (DESYREL ) 100 MG tablet, Take 3 tablets (300 mg total) by mouth at bedtime as needed for sleep., Disp: 90 tablet, Rfl: 5 Medication Side Effects: none  Family Medical/ Social History: Changes? No  MENTAL HEALTH EXAM:  There were no vitals taken for this visit.There is no height or weight on file to calculate BMI.  General Appearance: Casual, Well Groomed, and Obese  Eye Contact:  Good  Speech:  Clear and Coherent and Normal Rate  Volume:  Normal  Mood:  Euthymic  Affect:  Congruent  Thought Process:  Goal Directed and Descriptions of Associations: Circumstantial  Orientation:  Full (Time, Place, and Person)  Thought Content: Logical   Suicidal Thoughts:  No  Homicidal Thoughts:  No  Memory:  WNL  Judgement:  Good  Insight:  Good  Psychomotor Activity:  Normal  Concentration:  Concentration: Good  Recall:  Good  Fund of Knowledge: Good  Language: Good  Assets:  Communication Skills Desire for Improvement Financial Resources/Insurance Housing Resilience Transportation Vocational/Educational  ADL's:  Intact  Cognition: WNL  Prognosis:  Good   Labs 01/03/2024 TSH 1.39 CMP kidney functions are normal, glucose 102.  All other results are normal. Lithium  level 0.6.  DIAGNOSES:  ICD-10-CM   1. Bipolar I disorder (HCC)  F31.9     2. Lithium  induced hypothyroidism  T56.891A    E03.2     3. 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 No recent  controlled substances.  I provided 30 minutes of face to face time during this encounter, including time spent before and after the visit in records review, medical decision making, counseling pertinent to today's visit, and charting.   He is doing well on current treatment.  Lithium  level is on the low end of normal range, but he's stable so I'm not increasing the dose.   Continue Levothyroxine  100 mcg, 1 q am. Continue Lithium  300 mg, 6 po every day.  Continue Trazodone  100 mg, 3 at bedtime prn Return in 6 months.   Marvia Slocumb, PA-C

## 2024-02-03 DIAGNOSIS — Z419 Encounter for procedure for purposes other than remedying health state, unspecified: Secondary | ICD-10-CM | POA: Diagnosis not present

## 2024-03-04 DIAGNOSIS — Z419 Encounter for procedure for purposes other than remedying health state, unspecified: Secondary | ICD-10-CM | POA: Diagnosis not present

## 2024-03-14 ENCOUNTER — Telehealth: Payer: Self-pay | Admitting: Physician Assistant

## 2024-03-14 NOTE — Telephone Encounter (Signed)
 Message made in error

## 2024-04-04 DIAGNOSIS — Z419 Encounter for procedure for purposes other than remedying health state, unspecified: Secondary | ICD-10-CM | POA: Diagnosis not present

## 2024-05-05 DIAGNOSIS — Z419 Encounter for procedure for purposes other than remedying health state, unspecified: Secondary | ICD-10-CM | POA: Diagnosis not present

## 2024-06-04 DIAGNOSIS — Z419 Encounter for procedure for purposes other than remedying health state, unspecified: Secondary | ICD-10-CM | POA: Diagnosis not present

## 2024-07-31 ENCOUNTER — Other Ambulatory Visit: Payer: Self-pay | Admitting: Physician Assistant

## 2024-08-28 ENCOUNTER — Ambulatory Visit (INDEPENDENT_AMBULATORY_CARE_PROVIDER_SITE_OTHER): Admitting: Physician Assistant

## 2024-09-01 ENCOUNTER — Other Ambulatory Visit: Payer: Self-pay | Admitting: Physician Assistant

## 2024-09-18 ENCOUNTER — Ambulatory Visit: Admitting: Physician Assistant

## 2024-09-19 ENCOUNTER — Ambulatory Visit: Admitting: Physician Assistant

## 2024-09-19 ENCOUNTER — Encounter: Payer: Self-pay | Admitting: Physician Assistant

## 2024-09-19 VITALS — Ht 78.0 in | Wt 369.0 lb

## 2024-09-19 DIAGNOSIS — Z91148 Patient's other noncompliance with medication regimen for other reason: Secondary | ICD-10-CM

## 2024-09-19 DIAGNOSIS — F99 Mental disorder, not otherwise specified: Secondary | ICD-10-CM | POA: Diagnosis not present

## 2024-09-19 DIAGNOSIS — F5105 Insomnia due to other mental disorder: Secondary | ICD-10-CM | POA: Diagnosis not present

## 2024-09-19 DIAGNOSIS — F319 Bipolar disorder, unspecified: Secondary | ICD-10-CM | POA: Diagnosis not present

## 2024-09-19 DIAGNOSIS — E032 Hypothyroidism due to medicaments and other exogenous substances: Secondary | ICD-10-CM | POA: Diagnosis not present

## 2024-09-19 DIAGNOSIS — Z79899 Other long term (current) drug therapy: Secondary | ICD-10-CM

## 2024-09-19 MED ORDER — LEVOTHYROXINE SODIUM 100 MCG PO TABS: 100.0000 ug | ORAL_TABLET | Freq: Every day | ORAL | 1 refills | Status: AC

## 2024-09-19 MED ORDER — LITHIUM CARBONATE 300 MG PO TABS: 1800.0000 mg | ORAL_TABLET | Freq: Every day | ORAL | 5 refills | Status: DC

## 2024-09-19 NOTE — Progress Notes (Signed)
 "     [facility]  Patient ID: Erik Arroyo,  MRN: [id number]  PCP: Tanda Prentice DEL, MD  Date of Evaluation: 09/19/2024 Time spent:30 minutes  Chief Complaint:  Chief Complaint   Follow-up    HISTORY/CURRENT STATUS: HPI For routine med check.   Feels like his mood is stable.  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 That's stressful.  Energy and motivation are good.   No extreme sadness, tearfulness, or feelings of hopelessness.  Sleeps well with the Trazodone .  ADLs and personal hygiene are normal.   Denies any changes in concentration, making decisions, or remembering things.  He's working on losing weight b/c his A1C was high.   No SI/HI.  Patient denies increased energy with decreased need for sleep, increased talkativeness, racing thoughts, impulsivity or risky behaviors, increased spending, increased libido, grandiosity, increased irritability or anger, paranoia, or hallucinations.  Had labs drawn a few weeks ago. He states he's taking the Li as directed.  Specimen was drawn in the late morning.  Then he says he takes Lithium  all throughout the day.  2 q am, 2 in the afternoon, and 2 at night.  But sometimes I forget.  Sometimes it might be 1 in the late morning and 2 in the afternoon and 3 at night.  No symptoms of lithium  toxicity.  Review of Systems  Constitutional: Negative.   HENT: Negative.    Eyes: Negative.   Respiratory: Negative.    Cardiovascular: Negative.   Gastrointestinal: Negative.   Genitourinary: Negative.   Musculoskeletal: Negative.   Skin: Negative.   Neurological: Negative.   Endo/Heme/Allergies: Negative.   Psychiatric/Behavioral:         See HPI   Individual Medical History/ Review of Systems: Changes? :No   Past medications for mental health diagnoses include: Lithium , Seroquel  Allergies: Penicillins  Current  Medications:  Current Outpatient Medications:    fluticasone (FLONASE) 50 MCG/ACT nasal spray, Place 1 spray into both nostrils daily., Disp: , Rfl:    Fluticasone-Salmeterol (ADVAIR DISKUS) 250-50 MCG/DOSE AEPB, Inhale 1 puff into the lungs 2 (two) times daily., Disp: , Rfl:    magnesium 30 MG tablet, Take 30 mg by mouth every evening., Disp: , Rfl:    traZODone  (DESYREL ) 100 MG tablet, TAKE THREE TABLETS BY MOUTH EVERY DAY, Disp: 270 tablet, Rfl: 0   levothyroxine  (SYNTHROID ) 100 MCG tablet, Take 1 tablet (100 mcg total) by mouth daily before breakfast., Disp: 90 tablet, Rfl: 1   lithium  300 MG tablet, 1 p.o. every morning, 1 p.o. at lunch, and 4 p.o. at bedtime, Disp: , Rfl:    MOVIPREP  100 G SOLR, moviprep  as directed. No substitutions (Patient not taking: Reported on 09/16/2018), Disp: 1 kit, Rfl: 0 Medication Side Effects: none  Family Medical/ Social History: Changes? No  MENTAL HEALTH EXAM:  Height 6' 6 (1.981 m), weight (!) 369 lb (167.4 kg).Body mass index is 42.64 kg/m.  General Appearance: Casual, Well Groomed, and Obese  Eye Contact:  Good  Speech:  Clear and Coherent and Normal Rate  Volume:  Normal  Mood:  Euthymic  Affect:  Congruent  Thought Process:  Goal Directed and Descriptions of Associations: Circumstantial  Orientation:  Full (Time, Place, and Person)  Thought Content: Logical   Suicidal Thoughts:  No  Homicidal Thoughts:  No  Memory:  WNL  Judgement:  Fair  Insight:  Good  Psychomotor Activity:  Normal  Concentration:  Concentration: Good  Recall:  Good  Fund of Knowledge: Good  Language: Good  Assets:  Communication Skills Desire for Improvement Financial Resources/Insurance Housing Resilience Transportation Vocational/Educational  ADL's:  Intact  Cognition: WNL  Prognosis:  Good   Labs 08/28/2024 Lithium  level 0.3 CMP BUN, creatinine, and GFR are all normal, glucose 114, total protein 6.3 Hemoglobin A1c 6.4 TSH 1.35  DIAGNOSES:     ICD-10-CM   1. Bipolar I disorder (HCC)  F31.9 Lithium  level    CANCELED: Lithium  level    2. Encounter for long-term (current) use of medications  Z79.899 Lithium  level    CANCELED: Lithium  level    3. Variable compliance with medication therapy  Z91.148     4. Lithium  induced hypothyroidism  T56.891A    E03.2     5. 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 No recent controlled substances.  I provided approximately 30 minutes of face to face time during this encounter, including time spent before and after the visit in records review, medical decision making, counseling pertinent to today's visit, and charting.   We had a long discussion about the lithium .  He was made aware that lithium  is not a drug that he can take whenever he wants and not be consistent with it.  After much deliberation he finally agreed to take it more regularly.  He states it also helps with anxiety, that is why he takes it during the day.  However he sometimes does not take it if he is not really anxious.  Again, it should not be taken that way.  He was told that if he cannot or will not take it consistently then I will take him off of lithium  and prescribe a drug that will be safer in the long run and he will not be at risk for toxicity.  Continue Levothyroxine  100 mcg, 1 q am. Change Li 300 mg,  to 1 po q am, 1 at lunch, 4 at bedtime.  Continue Trazodone  100 mg, 3 at bedtime prn Get Li level in about 1 week after CONSISTENTLY taking the Li for a week.  Return in 6 months.   Verneita Cooks, PA-C  "

## 2024-09-22 MED ORDER — LITHIUM CARBONATE 300 MG PO TABS: ORAL_TABLET | ORAL | Status: AC

## 2025-03-19 ENCOUNTER — Ambulatory Visit: Admitting: Physician Assistant
